# Patient Record
Sex: Male | Born: 1975 | Race: White | Hispanic: No | Marital: Married
Health system: Midwestern US, Community
[De-identification: ages and names within clinical notes are randomized; demographics above are authoritative.]

## PROBLEM LIST (undated history)

## (undated) DIAGNOSIS — I8001 Phlebitis and thrombophlebitis of superficial vessels of right lower extremity: Secondary | ICD-10-CM

## (undated) HISTORY — PX: NO PAST SURGERIES: SHX2092

## (undated) HISTORY — DX: Phlebitis and thrombophlebitis of superficial vessels of right lower extremity: I80.01

---

## 2009-11-27 MED ORDER — HYDROCODONE-ACETAMINOPHEN 5 MG-500 MG TAB
5-500 mg | ORAL_TABLET | Freq: Four times a day (QID) | ORAL | Status: DC | PRN
Start: 2009-11-27 — End: 2013-04-10

## 2009-11-27 MED ORDER — CYCLOBENZAPRINE 10 MG TAB
10 mg | ORAL_TABLET | Freq: Three times a day (TID) | ORAL | Status: AC | PRN
Start: 2009-11-27 — End: 2009-12-07

## 2009-11-27 MED ORDER — IBUPROFEN 800 MG TAB
800 mg | ORAL_TABLET | Freq: Four times a day (QID) | ORAL | Status: AC | PRN
Start: 2009-11-27 — End: 2009-12-04

## 2009-11-27 MED ADMIN — HYDROmorphone (PF) (DILAUDID) injection 1 mg: INTRAMUSCULAR | @ 15:00:00 | NDC 00409255201

## 2009-11-27 MED ADMIN — ondansetron (ZOFRAN ODT) tablet 4 mg: ORAL | @ 15:00:00 | NDC 00781523806

## 2009-11-27 MED ADMIN — ketorolac (TORADOL) injection 60 mg: INTRAMUSCULAR | @ 15:00:00 | NDC 00409379501

## 2009-11-27 NOTE — Progress Notes (Signed)
I have reviewed discharge instructions with the patient.  The patient verbalized understanding.

## 2009-11-27 NOTE — ED Provider Notes (Signed)
Patient is a 34 y.o. male presenting with back pain. The history is provided by the patient.   Back Pain   This is a new problem. The current episode started yesterday. The problem has not changed since onset. The problem occurs constantly. The pain is associated with lifting. The pain is present in the lumbar spine. The quality of the pain is described as aching. The pain radiates to the right thigh. The pain is at a severity of 4/10. The pain is moderate. Pertinent negatives include no chest pain, no fever, no numbness, no weight loss, no headaches, no abdominal pain, no abdominal swelling, no bowel incontinence, no bladder incontinence, no dysuria and no pelvic pain. He has tried nothing for the symptoms.        No past medical history on file.     No past surgical history on file.      No family history on file.     History   Social History   ??? Marital Status: Married     Spouse Name: N/A     Number of Children: N/A   ??? Years of Education: N/A   Occupational History   ??? Not on file.   Social History Main Topics   ??? Smoking status: Current Everyday Smoker   ??? Smokeless tobacco: Not on file   ??? Alcohol Use:    ??? Drug Use:    ??? Sexually Active:    Other Topics Concern   ??? Not on file   Social History Narrative   ??? No narrative on file                    ALLERGIES: Percocet      Review of Systems   Constitutional: Negative for fever, weight loss, appetite change and unexpected weight change.   HENT: Negative for facial swelling, rhinorrhea, trouble swallowing, neck pain, dental problem and ear discharge.    Eyes: Negative for pain and discharge.   Respiratory: Negative for apnea and stridor.    Cardiovascular: Negative for chest pain, palpitations and leg swelling.   Gastrointestinal: Negative for vomiting, abdominal pain, diarrhea, blood in stool, abdominal distention and bowel incontinence.   Genitourinary: Negative for bladder incontinence, dysuria, hematuria, flank pain, difficulty urinating and pelvic pain.    Musculoskeletal: Positive for back pain. Negative for myalgias, joint swelling and arthralgias.   Skin: Negative for color change, rash and wound.   Neurological: Negative for facial asymmetry, speech difficulty, numbness and headaches.   Hematological: Negative for adenopathy.   Psychiatric/Behavioral: Negative for suicidal ideas, hallucinations, behavioral problems, self-injury and agitation.       Filed Vitals:    11/27/09 0917 11/27/09 1045   BP: 120/75    Pulse: 60    Temp: 98.1 ??F (36.7 ??C)    Resp: 18    Height: 5\' 7"  (1.702 m)    Weight: 140 lb (63.504 kg)    SpO2: 100% 99%              Physical Exam   Nursing note and vitals reviewed.  Constitutional: He is oriented to person, place, and time. He appears well-developed and well-nourished. He appears distressed.   HENT:   Head: Normocephalic and atraumatic.   Right Ear: External ear normal.   Left Ear: External ear normal.   Mouth/Throat: Oropharynx is clear and moist. No oropharyngeal exudate.   Eyes: Conjunctivae and EOM are normal. Pupils are equal, round, and reactive to light. Right eye exhibits no  discharge. Left eye exhibits no discharge. No scleral icterus.   Neck: Normal range of motion. Neck supple.   Cardiovascular: Normal rate, regular rhythm, normal heart sounds and intact distal pulses.    No murmur heard.  Pulmonary/Chest: Effort normal and breath sounds normal. No respiratory distress. He has no wheezes. He has no rales.   Abdominal: Soft. He exhibits no distension. No tenderness.   Musculoskeletal: He exhibits tenderness. He exhibits no edema.        Lumbar back: He exhibits decreased range of motion, tenderness, bony tenderness and spasm.        Distal sterrngth noted limited due to pain    Neurological: He is alert and oriented to person, place, and time. No cranial nerve deficit. Coordination normal.   Skin: Skin is warm. No rash noted. No erythema.    Psychiatric: He has a normal mood and affect. His behavior is normal. Judgment and thought content normal.        MDM    Procedures

## 2009-11-27 NOTE — ED Notes (Cosign Needed)
Will send home with muscle relaxants and pain medicine and follow up

## 2009-11-27 NOTE — ED Notes (Signed)
Bent down to pick up a log yesterday and heard a pop in his low back.  Pain radiates down right leg.

## 2009-11-27 NOTE — ED Provider Notes (Signed)
I have personally seen and evaluated patient. I find the patient's history and physical exam are consistent with the PA's NP documentation. I agree with the care provided, treatments rendered, disposition and follow up plan.

## 2013-04-10 MED ADMIN — diph,Pertuss(AC),Tet Vac-PF (BOOSTRIX) suspension 0.5 mL: INTRAMUSCULAR | @ 22:00:00 | NDC 58160084243

## 2013-04-10 MED ADMIN — lidocaine (PF) (XYLOCAINE) 20 mg/mL (2 %) injection 200 mg: INTRADERMAL | @ 22:00:00 | NDC 00409206605

## 2013-04-10 NOTE — ED Provider Notes (Signed)
I personally saw and examined the patient.  I have reviewed and agree with the MLP's findings, including all diagnostic interpretations, and plans as written.   I was present during the key portions of separately billed procedures.    Alyric Parkin A Lyndell Gillyard, MD

## 2013-04-10 NOTE — ED Notes (Signed)
Pt in CT.
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
## 2013-11-19 MED ORDER — HYDROCODONE-ACETAMINOPHEN 5 MG-325 MG TAB
5-325 mg | ORAL_TABLET | ORAL | Status: AC | PRN
Start: 2013-11-19 — End: ?

## 2013-11-19 MED ADMIN — HYDROcodone-acetaminophen (NORCO) 5-325 mg per tablet 2 Tab: ORAL | @ 05:00:00 | NDC 00406012323

## 2013-11-19 MED FILL — HYDROCODONE-ACETAMINOPHEN 5 MG-325 MG TAB: 5-325 mg | ORAL | Qty: 2

## 2013-11-19 NOTE — ED Notes (Signed)
Radiology at bedside for portable left wrist xray.

## 2013-11-19 NOTE — ED Notes (Signed)
Patient discharged by provider Liborio Nixon, PA. Ambulatory from ED with wife.

## 2013-11-19 NOTE — ED Notes (Signed)
Pt was roller skating and tripped over a child and fell onto left wrist pt is complaining of left wrist pain and left hand pain

## 2013-11-19 NOTE — ED Notes (Signed)
In room with patient for initial visit. Introduced self as primary nurse. Discussed care and expectations of visit. Call light placed within reach. SR up x 2. Family at bedside.

## 2013-11-19 NOTE — ED Notes (Signed)
Dr. Bartholomew at bedside.

## 2013-11-19 NOTE — ED Provider Notes (Signed)
HPI Comments: Brent Horton. is a 38 y.o. 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 He denies head injury or other injury, he is right hand dominant. He denies tx pta. He notes his wrist hurt more with movement and the pain radiates to his hand.   He specifically denies any fevers, chills, nausea, vomiting, chest pain, shortness of breath, headache, rash, diarrhea, sweating or weight loss.  Marland Kitchen    PCP: [facility]  PMHx significant for: No past medical history on file.  PSHx significant for: No past surgical history on file.  Social Hx: Tobacco: denies current EtOH: social Illicit drug use: denies    There are no further complaints or symptoms at this time.            No past medical history on file.     No past surgical history on file.      No family history on file.     History     Social History   ??? Marital Status: MARRIED     Spouse Name: N/A     Number of Children: N/A   ??? Years of Education: N/A     Occupational History   ??? Not on file.     Social History Main Topics   ??? Smoking status: Former Smoker   ??? Smokeless tobacco: Not on file   ??? Alcohol Use: No   ??? Drug Use: Not on file   ??? Sexual Activity: Not on file     Other Topics Concern   ??? Not on file     Social History Narrative   ??? No narrative on file                  ALLERGIES: Percocet      Review of Systems   Constitutional: Negative for fever and chills.   HENT: Negative for congestion, rhinorrhea, sneezing and sore throat.    Eyes: Negative for redness and visual disturbance.   Respiratory: Negative for shortness of breath.    Cardiovascular: Negative for chest pain and leg swelling.   Gastrointestinal: Negative for nausea, vomiting and abdominal pain.   Genitourinary: Negative for frequency and difficulty urinating.   Musculoskeletal: Positive for myalgias and arthralgias. Negative for back  pain and neck stiffness.   Skin: Negative for rash.   Neurological: Negative for dizziness, syncope, weakness and headaches.   Hematological: Negative for adenopathy.       Filed Vitals:    11/19/13 0031   BP: 137/79   Pulse: 57   Temp: 98.1 ??F (36.7 ??C)   Resp: 16   Height:  (1.702 m)   Weight: 63.504 kg (140 lb)   SpO2: 100%            Physical Exam   Constitutional: He is oriented to person, place, and time. He appears well-developed and well-nourished. No distress.   HENT:   Head: Normocephalic and atraumatic.   Right Ear: External ear normal.   Left Ear: External ear normal.   Nose: Nose normal.   Mouth/Throat: Oropharynx is clear and moist.   Eyes: EOM are normal. Pupils are equal, round, and reactive to light.   Neck: Normal range of motion.   Non-tender to midline and throughout. No swelling or step off. No discoloration or deformity. No lesions. Moves neck  full ROM without diff against resistance. Grips 5/5 bilaterally.    Cardiovascular: Normal rate, regular rhythm, normal heart sounds and intact distal pulses.  Exam reveals no gallop and no friction rub.    No murmur heard.  Pulmonary/Chest: Effort normal and breath sounds normal. No stridor. No respiratory distress. He has no wheezes. He has no rales. He exhibits no tenderness.   Musculoskeletal: He exhibits tenderness. He exhibits no edema.   Left wrist TTP- more focal to radius/ anatomical snuff box.+ slight swelling. No discoloration. No deformity . No lesions. ROM intact with increased pain with full ROM. Distal n/v intact. Cap refill brisk. normthermic   Neurological: He is alert and oriented to person, place, and time. He exhibits normal muscle tone. Coordination normal.   Skin: Skin is warm and dry. No rash noted. He is not diaphoretic. No erythema. No pallor.   Psychiatric: He has a normal mood and affect. His behavior is normal.   Nursing note and vitals reviewed.       MDM  Number of Diagnoses or Management Options      Amount and/or Complexity of Data Reviewed  Tests in the radiology section of CPT??: ordered and reviewed  Obtain history from someone other than the patient: yes (wife)  Review and summarize past medical records: yes  Independent visualization of images, tracings, or specimens: yes    Patient Progress  Patient progress: improved      Procedures    Procedure Note - Splint Placement:  1:11 AM  Performed by: Freddie Breech. Amier Hoyt, PA-C  Neurovascularly intact prior to tx.  An Orthoglass thumbspica splint was placed on pt's left hand.  Joint was placed in neutral position.  Neurovascularly intact after tx.   The procedure took 1-15 minutes, and pt tolerated well.  Freddie Breech. Terilyn Sano, PA-C    Chief Complaint   Patient presents with   ??? Wrist Pain       1:28 AM  The patients presenting problems have been discussed, and they are in agreement with the care plan formulated and outlined with them.  I have encouraged them to ask questions as they arise throughout their visit.      IMAGING COMPLETED AND REVIEWED:  The following have been ordered and reviewed:  XR WRIST LT AP/LAT/OBL   Final Result   **Final Report**          ICD Codes / Adm.Diagnosis: 140016   / Wrist Pain  wrist injury   Examination:  CR WRIST MIN 3 VWS LT  - [id number] - Nov 19 2013 12:59AM   Accession No:  [id number]   Reason:  Trauma         REPORT:   INDICATION: Trauma, wrist pain      COMPARISON: None      FINDINGS:       3 views of the left wrist submitted for review.      Acute nondisplaced fracture of the scaphoid waist. No other fracture seen.       No dislocation.           IMPRESSION:      Acute nondisplaced fracture of the scaphoid waist                   Signing/Reading Doctor: Loistine Simas [facility] [id number])     ApprovedLoistine Simas [facility] [phone])  Nov 19 2013  1:18AM  MEDICATIONS GIVEN:  Medications   HYDROcodone-acetaminophen (NORCO) 5-325 mg per tablet 2 Tab (2 Tabs Oral Given 11/19/13 0059)        CLINICAL IMPRESSION:   1. Fall, initial encounter    2. Scaphoid fracture of wrist, left, closed, initial encounter          Plan  1.norco  2.f/u ortho  Return to ED if sx's worsen    DISCHARGE NOTE:  1:28 AM  Freddie BreechJanice K. Azhar Yogi, PA-C spoke with Sonia SideFrancis J Oldenburg Jr. and family about sx, dx, tx, and rx with good understanding. Care plan outlined and precautions discussed. Pt has been re-examined. Pt is feeling better. There are no new complaints, changes, or physical findings. Reviewed results of  radiology with pt and family.  Medications given in ED and prescription medications were discussed with pt and family. All pt and family???s questions and concerns were addressed.  Family was instructed and agrees to have pt f/u with Ortho, as well as to have pt return to ED upon further deterioration. Pt is ready to go home.

## 2019-02-22 ENCOUNTER — Other Ambulatory Visit: Payer: Self-pay

## 2019-02-22 ENCOUNTER — Emergency Department: Payer: No Typology Code available for payment source

## 2019-02-22 ENCOUNTER — Encounter: Payer: Self-pay | Admitting: Emergency Medicine

## 2019-02-22 ENCOUNTER — Emergency Department
Admission: EM | Admit: 2019-02-22 | Discharge: 2019-02-22 | Disposition: A | Discharge status: Home / Self Care | Payer: No Typology Code available for payment source | Attending: Emergency Medicine | Admitting: Emergency Medicine

## 2019-02-22 DIAGNOSIS — Y999 Unspecified external cause status: Secondary | ICD-10-CM | POA: Diagnosis not present

## 2019-02-22 DIAGNOSIS — Y92414 Local residential or business street as the place of occurrence of the external cause: Secondary | ICD-10-CM | POA: Insufficient documentation

## 2019-02-22 DIAGNOSIS — Z87891 Personal history of nicotine dependence: Secondary | ICD-10-CM | POA: Diagnosis not present

## 2019-02-22 DIAGNOSIS — M546 Pain in thoracic spine: Secondary | ICD-10-CM | POA: Insufficient documentation

## 2019-02-22 DIAGNOSIS — Y93I9 Activity, other involving external motion: Secondary | ICD-10-CM | POA: Diagnosis not present

## 2019-02-22 MED ORDER — MELOXICAM 15 MG PO TABS: 15.0000 mg | ORAL_TABLET | Freq: Every day | ORAL | 1 refills | Status: AC

## 2019-02-22 MED ORDER — METHOCARBAMOL 500 MG PO TABS: 500.0000 mg | ORAL_TABLET | Freq: Three times a day (TID) | ORAL | 0 refills | Status: AC | PRN
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
## 2019-02-22 NOTE — ED Notes (Signed)
Called in all waiting areas with no answer.  
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
## 2019-08-14 ENCOUNTER — Encounter: Payer: Self-pay | Admitting: Emergency Medicine

## 2019-08-14 ENCOUNTER — Emergency Department: Payer: Self-pay

## 2019-08-14 ENCOUNTER — Other Ambulatory Visit: Payer: Self-pay

## 2019-08-14 DIAGNOSIS — I8001 Phlebitis and thrombophlebitis of superficial vessels of right lower extremity: Secondary | ICD-10-CM | POA: Insufficient documentation

## 2019-08-14 DIAGNOSIS — Z87891 Personal history of nicotine dependence: Secondary | ICD-10-CM | POA: Insufficient documentation

## 2019-08-14 NOTE — ED Triage Notes (Signed)
Pt with swelling and redness to the lower right leg x5 weeks. On assessment area is swollen and hard, follows up venous track. Bruising noted as well.

## 2019-08-15 ENCOUNTER — Emergency Department
Admission: EM | Admit: 2019-08-15 | Discharge: 2019-08-15 | Disposition: A | Discharge status: Home / Self Care | Payer: Self-pay | Attending: Emergency Medicine | Admitting: Emergency Medicine

## 2019-08-15 DIAGNOSIS — I8001 Phlebitis and thrombophlebitis of superficial vessels of right lower extremity: Secondary | ICD-10-CM

## 2019-08-15 LAB — CBC WITH DIFFERENTIAL/PLATELET
Abs Immature Granulocytes: 0.01 10*3/uL (ref 0.00–0.07)
Basophils Absolute: 0.1 10*3/uL (ref 0.0–0.1)
Basophils Relative: 1 %
Eosinophils Absolute: 0.3 10*3/uL (ref 0.0–0.5)
Eosinophils Relative: 4 %
HCT: 39.1 % (ref 39.0–52.0)
Hemoglobin: 13.8 g/dL (ref 13.0–17.0)
Immature Granulocytes: 0 %
Lymphocytes Relative: 31 %
Lymphs Abs: 2.5 10*3/uL (ref 0.7–4.0)
MCH: 32.7 pg (ref 26.0–34.0)
MCHC: 35.3 g/dL (ref 30.0–36.0)
MCV: 92.7 fL (ref 80.0–100.0)
Monocytes Absolute: 0.6 10*3/uL (ref 0.1–1.0)
Monocytes Relative: 7 %
Neutro Abs: 4.7 10*3/uL (ref 1.7–7.7)
Neutrophils Relative %: 57 %
Platelets: 232 10*3/uL (ref 150–400)
RBC: 4.22 MIL/uL (ref 4.22–5.81)
RDW: 12.9 % (ref 11.5–15.5)
WBC: 8.1 10*3/uL (ref 4.0–10.5)
nRBC: 0 % (ref 0.0–0.2)

## 2019-08-15 LAB — BASIC METABOLIC PANEL
Anion gap: 6 (ref 5–15)
BUN: 21 mg/dL — ABNORMAL HIGH (ref 6–20)
CO2: 30 mmol/L (ref 22–32)
Calcium: 9.1 mg/dL (ref 8.9–10.3)
Chloride: 104 mmol/L (ref 98–111)
Creatinine, Ser: 0.98 mg/dL (ref 0.61–1.24)
GFR calc Af Amer: >60 mL/min (ref >60)
GFR calc non Af Amer: >60 mL/min (ref >60)
Glucose, Bld: 88 mg/dL (ref 70–99)
Potassium: 3.9 mmol/L (ref 3.5–5.1)
Sodium: 140 mmol/L (ref 135–145)

## 2019-08-15 LAB — BRAIN NATRIURETIC PEPTIDE: B Natriuretic Peptide: 45.4 pg/mL (ref 0.0–100.0)

## 2019-08-15 MED ORDER — IBUPROFEN 800 MG PO TABS
800.0000 mg | ORAL_TABLET | Freq: Once | ORAL | Status: AC
  Administered 2019-08-15: 800 mg via ORAL
  Filled 2019-08-15: qty 1

## 2019-08-15 MED ORDER — IBUPROFEN 800 MG PO TABS: 800.0000 mg | ORAL_TABLET | Freq: Three times a day (TID) | ORAL | 0 refills | Status: DC | PRN

## 2019-08-15 MED ORDER — DICLOFENAC SODIUM 1 % EX GEL
2.0000 g | Freq: Three times a day (TID) | CUTANEOUS | Status: DC
  Administered 2019-08-15: 2 g via TOPICAL
  Filled 2019-08-15: qty 100

## 2019-08-15 NOTE — ED Notes (Signed)
Pt provided with pair of thigh high anti-embolism stockings, size medium

## 2019-08-15 NOTE — ED Notes (Signed)
No answer when called several times from lobby 

## 2019-08-15 NOTE — ED Provider Notes (Signed)
[facility] Emergency Department Provider Note ______   First MD Initiated Contact with Patient 08/15/19 0107     (approximate)  I have reviewed the triage vital signs and the nursing notes.   HISTORY  Chief Complaint Leg Swelling    HPI Nathan Stevenson is a 44 y.o. male presents emergency department secondary to medial right lower leg pain and redness times approximately 5 weeks with worsening redness noted today.  Patient states that the area is swollen and hard to touch.  Patient denies any fever.  Patient denies any chest pain or shortness of breath.        History reviewed. No pertinent past medical history.  There are no problems to display for this patient.   History reviewed. No pertinent surgical history.  Prior to Admission medications   Not on File    Allergies Patient has no known allergies.  History reviewed. No pertinent family history.  Social History Social History   Tobacco Use  . Smoking status: Former Research scientist (life sciences)  . Smokeless tobacco: Never Used  Substance Use Topics  . Alcohol use: Yes  . Drug use: Yes    Types: Marijuana    Review of Systems Constitutional: No fever/chills Eyes: No visual changes. ENT: No sore throat. Cardiovascular: Denies chest pain. Respiratory: Denies shortness of breath. Gastrointestinal: No abdominal pain.  No nausea, no vomiting.  No diarrhea.  No constipation. Genitourinary: Negative for dysuria. Musculoskeletal: Positive for right leg pain swelling redness Integumentary: Negative for rash. Neurological: Negative for headaches, focal weakness or numbness.   ____________________________________________   PHYSICAL EXAM:  VITAL SIGNS: ED Triage Vitals [08/14/19 2133]  Enc Vitals Group     BP 133/74     Pulse Rate (!) 55     Resp 18     Temp 98.1 F (36.7 C)     Temp Source Oral     SpO2 100 %     Weight      Height      Head Circumference      Peak Flow      Pain Score       Pain Loc      Pain Edu?      Excl. in [place]?     Constitutional: Alert and oriented.  Eyes: Conjunctivae are normal.  Mouth/Throat: Patient is wearing a mask. Neck: No stridor.  No meningeal signs.   Cardiovascular: Normal rate, regular rhythm. Good peripheral circulation. Grossly normal heart sounds. Respiratory: Normal respiratory effort.  No retractions. Gastrointestinal: Soft and nontender. No distention.  Musculoskeletal: Nonblanching erythema medial aspect of the right lower leg along the course of the greater saphenous vein Neurologic:  Normal speech and language. No gross focal neurologic deficits are appreciated.  Skin:  Skin is warm, dry and intact. Psychiatric: Mood and affect are normal. Speech and behavior are normal.  ____________________________________________   LABS (all labs ordered are listed, but only abnormal results are displayed)  Labs Reviewed  BASIC METABOLIC PANEL - Abnormal; Notable for the following components:      Result Value   BUN 21 (*)    All other components within normal limits  CBC WITH DIFFERENTIAL/PLATELET  BRAIN NATRIURETIC PEPTIDE       RADIOLOGY I, [employer], personally viewed and evaluated these images (plain radiographs) as part of my medical decision making, as well as reviewing the written report by the radiologist.  ED MD interpretation: No DVT superficial thrombophlebitis noted great saphenous vein level of the  medial calf per radiologist.  Official radiology report(s): US Venous Img Lower Unilateral Left  Result Date: 08/15/2019 CLINICAL DATA:  Swelling and redness to the left lower leg. EXAM: LEFT LOWER EXTREMITY VENOUS DOPPLER ULTRASOUND TECHNIQUE: Gray-scale sonography with compression, as well as color and duplex ultrasound, were performed to evaluate the deep venous system(s) from the level of the common femoral vein through the popliteal and proximal calf veins. COMPARISON:  None. FINDINGS: VENOUS Normal  compressibility of the common femoral, superficial femoral, and popliteal veins, as well as the visualized calf veins. Visualized portions of the profundus femoral vein are unremarkable. There is superficial thrombophlebitis involving the great saphenous vein at the level of the calf. No filling defects to suggest DVT on grayscale or color Doppler imaging. Doppler waveforms show normal direction of venous flow, normal respiratory plasticity and response to augmentation. Limited views of the contralateral common femoral vein are unremarkable. OTHER None. Limitations: none IMPRESSION: 1. No DVT. 2. There is superficial thrombophlebitis involving the great saphenous vein at the level of the medial calf. Electronically Signed   By: Katherine Mantle M.D.   On: 08/15/2019 00:14     Procedures   ____________________________________________   INITIAL IMPRESSION / MDM / ASSESSMENT AND PLAN / ED COURSE  As part of my medical decision making, I reviewed the following data within the electronic MEDICAL RECORD NUMBER    44 year old male presented with above-stated history and physical exam with differential diagnosis including but not limited to thrombophlebitis, DVT, cellulitis.  Clinical exam findings not consistent with cellulitis ultrasound revealed no evidence of DVT however did reveal thrombophlebitis of the great saphenous vein which is consistent with the location of the patient's discomfort and erythema.  Voltaren ointment applied patient given ibuprofen 800 mg.  Grade 2 compression stockings also applied.   ____________________________________________  FINAL CLINICAL IMPRESSION(S) / ED DIAGNOSES  Final diagnoses:  Thrombophlebitis of superficial veins of right lower extremity     MEDICATIONS GIVEN DURING THIS VISIT:  Medications - No data to display   ED Discharge Orders    None      *Please note:  Nathan Stevenson was evaluated in Emergency Department on 08/15/2019 for the symptoms  described in the history of present illness. He was evaluated in the context of the global COVID-19 pandemic, which necessitated consideration that the patient might be at risk for infection with the SARS-CoV-2 virus that causes COVID-19. Institutional protocols and algorithms that pertain to the evaluation of patients at risk for COVID-19 are in a state of rapid change based on information released by regulatory bodies including the CDC and federal and state organizations. These policies and algorithms were followed during the patient's care in the ED.  Some ED evaluations and interventions may be delayed as a result of limited staffing during the pandemic.*  Note:  This document was prepared using Dragon voice recognition software and may include unintentional dictation errors.   Darci Current, MD 08/15/19 [phone]

## 2019-10-17 ENCOUNTER — Encounter: Payer: Self-pay | Admitting: Family Medicine

## 2019-10-17 ENCOUNTER — Other Ambulatory Visit: Payer: Self-pay

## 2019-10-17 ENCOUNTER — Ambulatory Visit: Payer: Self-pay | Admitting: Family Medicine

## 2019-10-17 VITALS — BP 118/61 | HR 53 | Temp 98.4°F | Resp 16 | Ht 66.0 in | Wt 147.2 lb

## 2019-10-17 DIAGNOSIS — R634 Abnormal weight loss: Secondary | ICD-10-CM

## 2019-10-17 DIAGNOSIS — I8002 Phlebitis and thrombophlebitis of superficial vessels of left lower extremity: Secondary | ICD-10-CM

## 2019-10-17 DIAGNOSIS — Z79899 Other long term (current) drug therapy: Secondary | ICD-10-CM

## 2019-10-17 DIAGNOSIS — Z0000 Encounter for general adult medical examination without abnormal findings: Secondary | ICD-10-CM

## 2019-10-17 DIAGNOSIS — Z7689 Persons encountering health services in other specified circumstances: Secondary | ICD-10-CM

## 2019-10-17 DIAGNOSIS — I80292 Phlebitis and thrombophlebitis of other deep vessels of left lower extremity: Secondary | ICD-10-CM

## 2019-10-17 LAB — POCT URINALYSIS DIPSTICK
Bilirubin, UA: NEGATIVE
Blood, UA: NEGATIVE
Glucose, UA: NEGATIVE
Ketones, UA: NEGATIVE
Leukocytes, UA: NEGATIVE
Nitrite, UA: NEGATIVE
Protein, UA: NEGATIVE
Spec Grav, UA: <=1.005 — AB (ref 1.010–1.025)
Urobilinogen, UA: 0.2 E.U./dL
pH, UA: 5 (ref 5.0–8.0)

## 2019-10-17 NOTE — Patient Instructions (Signed)
A referral to Hematology, for your recurrent superficial thrombophlebitis has been placed today.  If you have not heard from the specialty office or our referral coordinator within 1 week, please let us know and we will follow up with the referral coordinator for an update.  Continue to wear compression stockings as directed.  Be sure to stay active and moving around, especially on your upcoming trip to [place].  To get up and stretch/walk around every few hours  Have your labs drawn in the next week  We will plan to see you back in 4 weeks for superficial thrombophlebitis follow up visit  You will receive a survey after today's visit either digitally by e-mail or paper by USPS mail. Your experiences and feedback matter to [place].  Please respond so we know how we are doing as we provide care for you.  Call us with any questions/concerns/needs.  It is my goal to be available to you for your health concerns.  Thanks for choosing me to be a partner in your healthcare needs!  Charlaine Dalton, FNP-C Family Nurse Practitioner [facility] Phone: [phone]

## 2019-10-17 NOTE — Assessment & Plan Note (Signed)
New patient establishment to [facility] 10/17/2019.  Reports has not followed with a PCP unless has been sick.  No records to request.  Plan: 1.  Baseline labs ordered to be drawn for evaluation

## 2019-10-17 NOTE — Assessment & Plan Note (Signed)
Reports superficial thrombophlebitis of left lower extremity approx 4 months ago, resolved and then returned approx 4 days ago.  Denies any history of clotting disorder for self or family history of.  Was told by ER to wear compression stocking and is currently wearing on left lower extremity through mid thigh.  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  Palpable area of thrombophlebitis behind left knee and along medial aspect of calf.  Plan: 1. Baseline labs ordered for evaluation 2. Referral to hematology placed for recurrent superficial thrombophlebitis and evaluation for clotting disorder 3. RTC in 4 weeks

## 2019-10-17 NOTE — Progress Notes (Signed)
Subjective:    Patient ID: Nathan Stevenson, male    DOB: [birth year], 44 y.o.   MRN: [id number]  Nathan Stevenson is a 44 y.o. male presenting on 10/17/2019 for Establish Care (Thrombophlebitis of superficial veins of right lower x 2 mths. Pt state symptoms improved, but returned x 4 days ago. )   HPI  [name] presents to clinic as a new patient establishment.  Reports has not had primary care in years, typically goes to the doctor if he has an acute concern.  Denies any chronic medical history.  Has acute concerns of having superficial thrombophlebitis approximately 4 months ago, this resolved and had reoccurrence approximately 4 days ago.  Has been wearing a compression stocking that he has been wearing at all times, with the exception of when showering.  Denies any family history of clotting disorders.  Denies shortness of breath, dizziness, lightheadedness, chest pain, DOE, abdominal pain, n/v/d.  Depression screen PHQ 2/9 10/17/2019  Decreased Interest 0  Down, Depressed, Hopeless 0  PHQ - 2 Score 0    Social History   Tobacco Use  . Smoking status: Former Smoker    Types: Cigarettes    Quit date: 10/17/2011    Years since quitting: 8.0  . Smokeless tobacco: Never Used  Vaping Use  . Vaping Use: Never used  Substance Use Topics  . Alcohol use: Yes    Alcohol/week: 12.0 standard drinks    Types: 12 Cans of beer per week  . Drug use: Yes    Types: Marijuana    Review of Systems  Constitutional: Negative.   HENT: Negative.   Eyes: Negative.   Respiratory: Negative.   Cardiovascular: Negative.   Gastrointestinal: Negative.   Endocrine: Negative.   Genitourinary: Negative.   Musculoskeletal: Positive for myalgias. Negative for arthralgias, back pain, gait problem, joint swelling, neck pain and neck stiffness.  Skin: Positive for color change. Negative for pallor, rash and wound.  Allergic/Immunologic: Negative.   Neurological: Negative.   Hematological: Negative.     Psychiatric/Behavioral: Negative.    Per HPI unless specifically indicated above     Objective:    BP 118/61 (BP Location: Right Arm, Patient Position: Sitting, Cuff Size: Normal)   Pulse (!) 53   Temp 98.4 F (36.9 C) (Oral)   Resp 16   Ht 5\' 6"  (1.676 m)   Wt 147 lb 3.2 oz (66.8 kg)   SpO2 100%   BMI 23.76 kg/m   Wt Readings from Last 3 Encounters:  10/17/19 147 lb 3.2 oz (66.8 kg)  02/22/19 165 lb (74.8 kg)    Physical Exam Vitals reviewed.  Constitutional:      General: He is not in acute distress.    Appearance: Normal appearance. He is well-developed, well-groomed and normal weight. He is not ill-appearing or toxic-appearing.  HENT:     Head: Normocephalic and atraumatic.     Nose:     Comments: 02/24/19 is in place, covering mouth and nose. Eyes:     General:        Right eye: No discharge.        Left eye: No discharge.     Extraocular Movements: Extraocular movements intact.     Conjunctiva/sclera: Conjunctivae normal.     Pupils: Pupils are equal, round, and reactive to light.  Cardiovascular:     Rate and Rhythm: Normal rate and regular rhythm.     Pulses: Normal pulses.          Dorsalis pedis  pulses are 2+ on the right side and 2+ on the left side.     Heart sounds: Normal heart sounds. No murmur heard.  No friction rub. No gallop.   Pulmonary:     Effort: Pulmonary effort is normal. No respiratory distress.     Breath sounds: Normal breath sounds.  Musculoskeletal:        General: Swelling and tenderness present.     Right lower leg: No edema.     Left lower leg: No edema.       Legs:  Skin:    General: Skin is warm and dry.     Capillary Refill: Capillary refill takes less than 2 seconds.     Findings: Erythema present.  Neurological:     General: No focal deficit present.     Mental Status: He is alert and oriented to person, place, and time.  Psychiatric:        Attention and Perception: Attention and perception normal.        Mood and  Affect: Mood and affect normal.        Speech: Speech normal.        Behavior: Behavior normal. Behavior is cooperative.        Thought Content: Thought content normal.        Cognition and Memory: Cognition and memory normal.    Results for orders placed or performed in visit on 10/17/19  POCT Urinalysis Dipstick  Result Value Ref Range   Color, UA Yellow    Clarity, UA clear    Glucose, UA Negative Negative   Bilirubin, UA negative    Ketones, UA negative    Spec Grav, UA <=1.005 (A) 1.010 - 1.025   Blood, UA negative    pH, UA 5.0 5.0 - 8.0   Protein, UA Negative Negative   Urobilinogen, UA 0.2 0.2 or 1.0 E.U./dL   Nitrite, UA negative    Leukocytes, UA Negative Negative   Appearance     Odor        Assessment & Plan:   Problem List Items Addressed This Visit      Cardiovascular and Mediastinum   Superficial thrombophlebitis of left leg    Reports superficial thrombophlebitis of left lower extremity approx 4 months ago, resolved and then returned approx 4 days ago.  Denies any history of clotting disorder for self or family history of.  Was told by ER to wear compression stocking and is currently wearing on left lower extremity through mid thigh.  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  Palpable area of thrombophlebitis behind left knee and along medial aspect of calf.  Plan: 1. Baseline labs ordered for evaluation 2. Referral to hematology placed for recurrent superficial thrombophlebitis and evaluation for clotting disorder 3. RTC in 4 weeks      Relevant Medications   aspirin 325 MG tablet     Other   Encounter to establish care with new doctor    New patient establishment to [facility] 10/17/2019.  Reports has not followed with a PCP unless has been sick.  No records to request.  Plan: 1.  Baseline labs ordered to be drawn for evaluation        Other Visit Diagnoses    Routine medical exam    -  Primary   Relevant Orders   CBC with  Differential   POCT Urinalysis Dipstick (Completed)   Long-term use of high-risk medication       Relevant Orders  Lipid Profile   Weight loss       Relevant Orders   Thyroid Panel With TSH   Thrombophlebitis of left lower extremity (HCC)       Relevant Medications   aspirin 325 MG tablet   Other Relevant Orders   COMPLETE METABOLIC PANEL WITH GFR   Lipid Profile   Thyroid Panel With TSH   Ambulatory referral to Hematology      No orders of the defined types were placed in this encounter.     Follow up plan: Return in about 4 weeks (around 11/14/2019) for Superficial thrombophlebitis f/u.   Charlaine Dalton, FNP Family Nurse Practitioner [facility] 10/17/2019, 11:29 AM

## 2019-10-23 ENCOUNTER — Other Ambulatory Visit: Payer: Self-pay

## 2019-10-23 ENCOUNTER — Encounter: Payer: Self-pay | Admitting: *Deleted

## 2019-10-23 ENCOUNTER — Inpatient Hospital Stay: Payer: Self-pay | Attending: Internal Medicine | Admitting: Internal Medicine

## 2019-10-23 ENCOUNTER — Inpatient Hospital Stay: Payer: Self-pay

## 2019-10-23 DIAGNOSIS — I8002 Phlebitis and thrombophlebitis of superficial vessels of left lower extremity: Secondary | ICD-10-CM | POA: Insufficient documentation

## 2019-10-23 DIAGNOSIS — Z79899 Other long term (current) drug therapy: Secondary | ICD-10-CM | POA: Insufficient documentation

## 2019-10-23 DIAGNOSIS — Z87891 Personal history of nicotine dependence: Secondary | ICD-10-CM | POA: Insufficient documentation

## 2019-10-23 DIAGNOSIS — Z885 Allergy status to narcotic agent status: Secondary | ICD-10-CM | POA: Insufficient documentation

## 2019-10-23 DIAGNOSIS — Z7289 Other problems related to lifestyle: Secondary | ICD-10-CM | POA: Insufficient documentation

## 2019-10-23 DIAGNOSIS — M7989 Other specified soft tissue disorders: Secondary | ICD-10-CM | POA: Insufficient documentation

## 2019-10-23 DIAGNOSIS — Z809 Family history of malignant neoplasm, unspecified: Secondary | ICD-10-CM | POA: Insufficient documentation

## 2019-10-23 DIAGNOSIS — Z7901 Long term (current) use of anticoagulants: Secondary | ICD-10-CM | POA: Insufficient documentation

## 2019-10-23 LAB — CBC WITH DIFFERENTIAL/PLATELET
Abs Immature Granulocytes: 0.02 10*3/uL (ref 0.00–0.07)
Basophils Absolute: 0 10*3/uL (ref 0.0–0.1)
Basophils Relative: 0 %
Eosinophils Absolute: 0.1 10*3/uL (ref 0.0–0.5)
Eosinophils Relative: 1 %
HCT: 38.2 % — ABNORMAL LOW (ref 39.0–52.0)
Hemoglobin: 13.5 g/dL (ref 13.0–17.0)
Immature Granulocytes: 0 %
Lymphocytes Relative: 23 %
Lymphs Abs: 1.8 10*3/uL (ref 0.7–4.0)
MCH: 32.5 pg (ref 26.0–34.0)
MCHC: 35.3 g/dL (ref 30.0–36.0)
MCV: 92 fL (ref 80.0–100.0)
Monocytes Absolute: 0.5 10*3/uL (ref 0.1–1.0)
Monocytes Relative: 6 %
Neutro Abs: 5.6 10*3/uL (ref 1.7–7.7)
Neutrophils Relative %: 70 %
Platelets: 264 10*3/uL (ref 150–400)
RBC: 4.15 MIL/uL — ABNORMAL LOW (ref 4.22–5.81)
RDW: 12.1 % (ref 11.5–15.5)
WBC: 8 10*3/uL (ref 4.0–10.5)
nRBC: 0 % (ref 0.0–0.2)

## 2019-10-23 LAB — COMPREHENSIVE METABOLIC PANEL
ALT: 14 U/L (ref 0–44)
AST: 21 U/L (ref 15–41)
Albumin: 4.8 g/dL (ref 3.5–5.0)
Alkaline Phosphatase: 64 U/L (ref 38–126)
Anion gap: 7 (ref 5–15)
BUN: 16 mg/dL (ref 6–20)
CO2: 30 mmol/L (ref 22–32)
Calcium: 9.1 mg/dL (ref 8.9–10.3)
Chloride: 102 mmol/L (ref 98–111)
Creatinine, Ser: 0.98 mg/dL (ref 0.61–1.24)
GFR calc Af Amer: >60 mL/min (ref >60)
GFR calc non Af Amer: >60 mL/min (ref >60)
Glucose, Bld: 97 mg/dL (ref 70–99)
Potassium: 3.9 mmol/L (ref 3.5–5.1)
Sodium: 139 mmol/L (ref 135–145)
Total Bilirubin: 0.9 mg/dL (ref 0.3–1.2)
Total Protein: 7.8 g/dL (ref 6.5–8.1)

## 2019-10-23 LAB — ANTITHROMBIN III: AntiThromb III Func: 112 % (ref 75–120)

## 2019-10-23 MED ORDER — APIXABAN 5 MG PO TABS: 5.0000 mg | ORAL_TABLET | Freq: Two times a day (BID) | ORAL | 0 refills | Status: DC

## 2019-10-23 NOTE — Progress Notes (Signed)
[facility] CONSULT NOTE  Patient Care Team: Malfi, Jodelle Gross, FNP as PCP - General (Family Medicine)  CHIEF COMPLAINTS/PURPOSE OF CONSULTATION: DVT/PE  MAY 18th, 2021- LEFT LE US_ . No DVT. 2. There is superficial thrombophlebitis involving the great saphenous vein at the level of the medial calf. Oncology History   No history exists.     HISTORY OF PRESENTING ILLNESS:  Nathan Stevenson 44 y.o.  male new diagnosis of Superficial thrombophlebitis.   Patient admits to trauma with farm equipment-few days after noted to have swelling and pain of the superficial thrombophlebitis.  Patient was appropriate recommended conservative measures with NSAIDs.  However pain did not improve.  He has been referred to [place] for further evaluation recommendations.  With regards risk factors: Long distance travel-none  immobilization/trauma:   Admits to trauma; no immobilization. Previous history of superficial venous clot- ~ 3- 4 years [[place], [place]]  Family history: none   Review of Systems  Constitutional: Negative for chills, diaphoresis, fever, malaise/fatigue and weight loss.  HENT: Negative for nosebleeds and sore throat.   Eyes: Negative for double vision.  Respiratory: Negative for cough, hemoptysis, sputum production, shortness of breath and wheezing.   Cardiovascular: Negative for chest pain, palpitations, orthopnea and leg swelling.  Gastrointestinal: Negative for abdominal pain, blood in stool, constipation, diarrhea, heartburn, melena, nausea and vomiting.  Genitourinary: Negative for dysuria, frequency and urgency.  Musculoskeletal: Negative for back pain and joint pain.  Skin: Negative.  Negative for itching and rash.  Neurological: Negative for dizziness, tingling, focal weakness, weakness and headaches.  Endo/Heme/Allergies: Does not bruise/bleed easily.  Psychiatric/Behavioral: Negative for depression. The patient is not nervous/anxious and does not have insomnia.       MEDICAL HISTORY:  Past Medical History:  Diagnosis Date  . Thrombophlebitis of superficial veins of right lower extremity     SURGICAL HISTORY: No past surgical history on file.  SOCIAL HISTORY: Social History   Socioeconomic History  . Marital status: Legally Separated    Spouse name: Not on file  . Number of children: Not on file  . Years of education: Not on file  . Highest education level: Not on file  Occupational History  . Not on file  Tobacco Use  . Smoking status: Former Smoker    Types: Cigarettes    Quit date: 10/17/2011    Years since quitting: 8.0  . Smokeless tobacco: Never Used  Vaping Use  . Vaping Use: Never used  Substance and Sexual Activity  . Alcohol use: Yes    Alcohol/week: 12.0 standard drinks    Types: 12 Cans of beer per week  . Drug use: Yes    Types: Marijuana  . Sexual activity: Not on file  Other Topics Concern  . 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    Social Determinants of Health   Financial Resource Strain:   . Difficulty of Paying Living Expenses:   Food Insecurity:   . Worried About Programme researcher, broadcasting/film/video in the Last Year:   . Barista in the Last Year:   Transportation Needs:   . Freight forwarder (Medical):   Marland Kitchen Lack of Transportation (Non-Medical):   Physical Activity:   . Days of Exercise per Week:   . Minutes of Exercise per Session:   Stress:   . Feeling of Stress :   Social Connections:   . Frequency of  Communication with Friends and Family:   . Frequency of Social Gatherings with Friends and Family:   . Attends Religious Services:   . Active Member of Clubs or Organizations:   . Attends Banker Meetings:   Marland Kitchen Marital Status:   Intimate Partner Violence:   . Fear of Current or Ex-Partner:   . Emotionally Abused:   Marland Kitchen Physically Abused:   . Sexually Abused:     FAMILY HISTORY: Family History  Problem Relation Age  of Onset  . Cancer Mother     ALLERGIES:  is allergic to percocet [oxycodone-acetaminophen].  MEDICATIONS:  Current Outpatient Medications  Medication Sig Dispense Refill  . acetaminophen (TYLENOL) 500 MG tablet Take 1,000 mg by mouth 2 (two) times daily as needed.     Marland Kitchen apixaban (ELIQUIS) 5 MG TABS tablet Take 1 tablet (5 mg total) by mouth 2 (two) times daily. 60 tablet 0  . aspirin 325 MG tablet Take 650 mg by mouth daily as needed.     Marland Kitchen ibuprofen (ADVIL) 800 MG tablet Take 1 tablet (800 mg total) by mouth every 8 (eight) hours as needed. (Patient taking differently: Take 800 mg by mouth 3 (three) times daily. ) 30 tablet 0   No current facility-administered medications for this visit.      Marland Kitchen  PHYSICAL EXAMINATION:  Vitals:   10/23/19 1411  BP: (!) 135/77  Pulse: 56  Resp: 20  Temp: 97.9 F (36.6 C)   Filed Weights   10/23/19 1411  Weight: 143 lb (64.9 kg)    Physical Exam HENT:     Head: Normocephalic and atraumatic.     Mouth/Throat:     Pharynx: No oropharyngeal exudate.  Eyes:     Pupils: Pupils are equal, round, and reactive to light.  Cardiovascular:     Rate and Rhythm: Normal rate and regular rhythm.  Pulmonary:     Effort: Pulmonary effort is normal. No respiratory distress.     Breath sounds: Normal breath sounds. No wheezing.  Abdominal:     General: Bowel sounds are normal. There is no distension.     Palpations: Abdomen is soft. There is no mass.     Tenderness: There is no abdominal tenderness. There is no guarding or rebound.  Musculoskeletal:        General: No tenderness. Normal range of motion.     Cervical back: Normal range of motion and neck supple.  Skin:    General: Skin is warm.     Comments: Thrombophlebitis/swelling pain redness of left superficial vein  Neurological:     Mental Status: He is alert and oriented to person, place, and time.  Psychiatric:        Mood and Affect: Affect normal.      LABORATORY DATA:  I have  reviewed the data as listed Lab Results  Component Value Date   WBC 8.0 10/23/2019   HGB 13.5 10/23/2019   HCT 38.2 (L) 10/23/2019   MCV 92.0 10/23/2019   PLT 264 10/23/2019   Recent Labs    08/15/19 0049 10/23/19 1458  NA 140 139  K 3.9 3.9  CL 104 102  CO2 30 30  GLUCOSE 88 97  BUN 21* 16  CREATININE 0.98 0.98  CALCIUM 9.1 9.1  GFRNONAA >60 >60  GFRAA >60 >60  PROT  --  7.8  ALBUMIN  --  4.8  AST  --  21  ALT  --  14  ALKPHOS  --  64  BILITOT  --  0.9    RADIOGRAPHIC STUDIES: I have personally reviewed the radiological images as listed and agreed with the findings in the report. No results found.  ASSESSMENT & PLAN:   Superficial thrombophlebitis of left leg #Superficial veno [place] thrombosis-question trauma versus others.  Quite symptomatic- given the lack of improvement with conservative measures; I would recommend initiation of anticoagulation.  #Discussed bleeding precautions with the patient; including fall precautions.  Discussed with pharmacy; assistance program offered to patient.  Thank you, Dr.Malfi for allowing me to participate in the care of your pleasant patient. Please do not hesitate to contact me with questions or concerns in the interim.  # DISPOSITION:  # labs today- ordered # follow up in 4 weeks- No labs- Dr.B    All questions were answered. The patient knows to call the clinic with any problems, questions or concerns.       Earna Coder, MD 11/05/2019 7:21 PM

## 2019-10-23 NOTE — Assessment & Plan Note (Addendum)
#  Superficial veno [place] thrombosis-question trauma versus others.  Quite symptomatic- given the lack of improvement with conservative measures; I would recommend initiation of anticoagulation.  #Discussed bleeding precautions with the patient; including fall precautions.  Discussed with pharmacy; assistance program offered to patient.  Thank you, Dr.Malfi for allowing me to participate in the care of your pleasant patient. Please do not hesitate to contact me with questions or concerns in the interim.  # DISPOSITION:  # labs today- ordered # follow up in 4 weeks- No labs- Dr.B

## 2019-10-24 LAB — ANTIPHOSPHOLIPID SYNDROME PROF
Anticardiolipin IgG: <9 GPL U/mL (ref 0–14)
Anticardiolipin IgM: <9 MPL U/mL (ref 0–12)
DRVVT: 34.5 s (ref 0.0–47.0)
PTT Lupus Anticoagulant: 32 s (ref 0.0–51.9)

## 2019-10-24 LAB — PROTEIN S, TOTAL: Protein S Ag, Total: 84 % (ref 60–150)

## 2019-10-26 LAB — FACTOR 5 LEIDEN

## 2019-10-26 LAB — PROTEIN C, TOTAL: Protein C, Total: 81 % (ref 60–150)

## 2019-10-27 LAB — PROTHROMBIN GENE MUTATION

## 2019-11-20 ENCOUNTER — Inpatient Hospital Stay: Payer: Self-pay | Attending: Internal Medicine | Admitting: Internal Medicine

## 2019-11-20 ENCOUNTER — Other Ambulatory Visit: Payer: Self-pay

## 2019-11-20 ENCOUNTER — Other Ambulatory Visit: Payer: Self-pay | Admitting: *Deleted

## 2019-11-20 DIAGNOSIS — Z87891 Personal history of nicotine dependence: Secondary | ICD-10-CM | POA: Insufficient documentation

## 2019-11-20 DIAGNOSIS — Z7901 Long term (current) use of anticoagulants: Secondary | ICD-10-CM | POA: Insufficient documentation

## 2019-11-20 DIAGNOSIS — Z79899 Other long term (current) drug therapy: Secondary | ICD-10-CM | POA: Insufficient documentation

## 2019-11-20 DIAGNOSIS — Z885 Allergy status to narcotic agent status: Secondary | ICD-10-CM | POA: Insufficient documentation

## 2019-11-20 DIAGNOSIS — I8002 Phlebitis and thrombophlebitis of superficial vessels of left lower extremity: Secondary | ICD-10-CM

## 2019-11-20 DIAGNOSIS — Z86718 Personal history of other venous thrombosis and embolism: Secondary | ICD-10-CM | POA: Insufficient documentation

## 2019-11-20 DIAGNOSIS — Z809 Family history of malignant neoplasm, unspecified: Secondary | ICD-10-CM | POA: Insufficient documentation

## 2019-11-20 MED ORDER — APIXABAN 5 MG PO TABS: 5.0000 mg | ORAL_TABLET | Freq: Two times a day (BID) | ORAL | 0 refills | Status: AC

## 2019-11-20 MED ORDER — APIXABAN 5 MG PO TABS: 5.0000 mg | ORAL_TABLET | Freq: Two times a day (BID) | ORAL | 0 refills | Status: DC

## 2019-11-20 NOTE — Assessment & Plan Note (Addendum)
#  Superficial venous thrombosis-left lower extremity; currently on Eliquis for the last 1 month.  Etiology is unclear; hypercoagulable work-up negative so far.  Improvement noted however completely not resolved.    Would recommend continue anticoagulation-given the lack of complete resolution of symptoms.  Recommend ultrasound of the lower extremity.  We will also refer to vascular surgery for further evaluation recommendations.   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

## 2019-11-20 NOTE — Progress Notes (Signed)
[facility] CONSULT NOTE  Patient Care Team: Malfi, Jodelle Gross, FNP as PCP - General (Family Medicine)  CHIEF COMPLAINTS/PURPOSE OF CONSULTATION: DVT/PE  MAY 18th, 2021- LEFT LE [place] . No DVT-superficial thrombophlebitis involving the great saphenous vein at the level of the medial calf.  July 24th 2021-Eliquis; hypercoagulable work-up negative. Prior history of SVT 3 to 4 years ago [[place]]  Oncology History   No history exists.     HISTORY OF PRESENTING ILLNESS:  Nathan Stevenson 44 y.o.  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  Patient was started on Eliquis approximately 4 weeks ago given the lack of improvement of his leg symptoms with conservative measures.  Patient swelling pain improved however not complete as well.  He continues on Eliquis.  Admits to compliance.  Review of Systems  Constitutional: Negative for chills, diaphoresis, fever, malaise/fatigue and weight loss.  HENT: Negative for nosebleeds and sore throat.   Eyes: Negative for double vision.  Respiratory: Negative for cough, hemoptysis, sputum production, shortness of breath and wheezing.   Cardiovascular: Negative for chest pain, palpitations, orthopnea and leg swelling.  Gastrointestinal: Negative for abdominal pain, blood in stool, constipation, diarrhea, heartburn, melena, nausea and vomiting.  Genitourinary: Negative for dysuria, frequency and urgency.  Musculoskeletal: Negative for back pain and joint pain.  Skin: Negative.  Negative for itching and rash.  Neurological: Negative for dizziness, tingling, focal weakness, weakness and headaches.  Endo/Heme/Allergies: Does not bruise/bleed easily.  Psychiatric/Behavioral: Negative for depression. The patient is not nervous/anxious and does not have insomnia.      MEDICAL HISTORY:  Past Medical History:  Diagnosis Date  .  Thrombophlebitis of superficial veins of right lower extremity     SURGICAL HISTORY: No past surgical history on file.  SOCIAL HISTORY: Social History   Socioeconomic History  . Marital status: Legally Separated    Spouse name: Not on file  . Number of children: Not on file  . Years of education: Not on file  . Highest education level: Not on file  Occupational History  . Not on file  Tobacco Use  . Smoking status: Former Smoker    Types: Cigarettes    Quit date: 10/17/2011    Years since quitting: 8.0  . Smokeless tobacco: Never Used  Vaping Use  . Vaping Use: Never used  Substance and Sexual Activity  . Alcohol use: Yes    Alcohol/week: 12.0 standard drinks    Types: 12 Cans of beer per week  . Drug use: Yes    Types: Marijuana  . Sexual activity: Not on file  Other Topics Concern  . 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    Social Determinants of Health   Financial Resource Strain:   . Difficulty of Paying Living Expenses: Not on file  Food Insecurity:   . Worried About Programme researcher, broadcasting/film/video in the Last Year: Not on file  . Ran Out of Food in the Last Year: Not on file  Transportation Needs:   . Lack of Transportation (Medical): Not on file  . Lack of Transportation (Non-Medical): Not on file  Physical Activity:   . Days of Exercise per Week: Not on file  . Minutes of Exercise per Session: Not on file  Stress:   . Feeling of Stress : Not on file  Social Connections:   . Frequency of Communication with Friends and Family: Not on file  . Frequency of Social Gatherings with Friends and Family: Not on file  . Attends Religious Services: Not on file  . Active Member of Clubs or Organizations: Not on file  . Attends Banker Meetings: Not on file  . Marital Status: Not on file  Intimate Partner Violence:   . Fear of Current or Ex-Partner: Not on file  . Emotionally Abused: Not on  file  . Physically Abused: Not on file  . Sexually Abused: Not on file    FAMILY HISTORY: Family History  Problem Relation Age of Onset  . Cancer Mother     ALLERGIES:  is allergic to percocet [oxycodone-acetaminophen].  MEDICATIONS:  Current Outpatient Medications  Medication Sig Dispense Refill  . acetaminophen (TYLENOL) 500 MG tablet Take 1,000 mg by mouth 2 (two) times daily as needed.     Marland Kitchen apixaban (ELIQUIS) 5 MG TABS tablet Take 1 tablet (5 mg total) by mouth 2 (two) times daily. 60 tablet 0   No current facility-administered medications for this visit.      Marland Kitchen  PHYSICAL EXAMINATION:  Vitals:   11/20/19 1300  BP: (!) 142/85  Pulse: (!) 48  Resp: 20  Temp: (!) 96.5 F (35.8 C)   Filed Weights   11/20/19 1309  Weight: 143 lb (64.9 kg)    Physical Exam HENT:     Head: Normocephalic and atraumatic.     Mouth/Throat:     Pharynx: No oropharyngeal exudate.  Eyes:     Pupils: Pupils are equal, round, and reactive to light.  Cardiovascular:     Rate and Rhythm: Normal rate and regular rhythm.  Pulmonary:     Effort: Pulmonary effort is normal. No respiratory distress.     Breath sounds: Normal breath sounds. No wheezing.  Abdominal:     General: Bowel sounds are normal. There is no distension.     Palpations: Abdomen is soft. There is no mass.     Tenderness: There is no abdominal tenderness. There is no guarding or rebound.  Musculoskeletal:        General: No tenderness. Normal range of motion.     Cervical back: Normal range of motion and neck supple.  Skin:    General: Skin is warm.     Comments: Thrombophlebitis/swelling pain redness of left superficial vein  Neurological:     Mental Status: He is alert and oriented to person, place, and time.  Psychiatric:        Mood and Affect: Affect normal.      LABORATORY DATA:  I have reviewed the data as listed Lab Results  Component Value Date   WBC 8.0 10/23/2019   HGB 13.5 10/23/2019   HCT 38.2  (L) 10/23/2019   MCV 92.0 10/23/2019   PLT 264 10/23/2019   Recent Labs    08/15/19 0049 10/23/19 1458  NA 140 139  K 3.9 3.9  CL 104 102  CO2 30 30  GLUCOSE 88 97  BUN 21* 16  CREATININE 0.98 0.98  CALCIUM 9.1 9.1  GFRNONAA >60 >60  GFRAA >60 >60  PROT  --  7.8  ALBUMIN  --  4.8  AST  --  21  ALT  --  14  ALKPHOS  --  64  BILITOT  --  0.9    RADIOGRAPHIC STUDIES: I have personally reviewed the radiological images as listed and agreed with the findings  in the report. No results found.  ASSESSMENT & PLAN:   Superficial thrombophlebitis of left leg #Superficial venous thrombosis-left lower extremity; currently on Eliquis for the last 1 month.  Etiology is unclear; hypercoagulable work-up negative so far.  Improvement noted however completely not resolved.    Would recommend continue anticoagulation-given the lack of complete resolution of symptoms.  Recommend ultrasound of the lower extremity.  We will also refer to vascular surgery for further evaluation recommendations.   # DISPOSITION:  # [place] Left LE ASAP # referral to Dr.Dew/Schnier re: recurrent SVT # follow up in 4 weeks- MD; no labs- Dr.B    Earna Coder, MD 11/20/2019 2:29 PM

## 2019-11-21 ENCOUNTER — Ambulatory Visit
Admission: RE | Admit: 2019-11-21 | Discharge: 2019-11-21 | Disposition: A | Discharge status: Home / Self Care | Payer: Self-pay | Source: Ambulatory Visit | Attending: Internal Medicine | Admitting: Internal Medicine

## 2019-11-21 ENCOUNTER — Other Ambulatory Visit: Payer: Self-pay

## 2019-11-21 ENCOUNTER — Telehealth: Payer: Self-pay | Admitting: Pharmacy Technician

## 2019-11-21 DIAGNOSIS — I8002 Phlebitis and thrombophlebitis of superficial vessels of left lower extremity: Secondary | ICD-10-CM | POA: Insufficient documentation

## 2019-11-21 NOTE — Telephone Encounter (Signed)
[name] Encounter  Met patient in room to complete application for [place] Patient Assistance in an effort to reduce patient's out of pocket expense for Eliquis to $0.    Application completed and faxed to [phone].   [place] patient assistance phone number for follow up is [phone].   This encounter will be updated until final determination.   [place] Patient [place] Phone [phone] Fax [phone] 11/21/2019 3:24 PM

## 2019-11-22 ENCOUNTER — Telehealth: Payer: Self-pay | Admitting: Internal Medicine

## 2019-11-22 NOTE — Telephone Encounter (Signed)
I tried to reach pt re: results of [place]- STABLE/ but continued SVT. Await evaluation with Dr.Dew as planned on 08/27.  H-please inform pt of above; follow up as planned. Thanks, GB  FYI-Dr.Dew

## 2019-11-23 NOTE — Telephone Encounter (Signed)
Called patient and left message to return call about needing financial documents for application.  Daine Floras CPHT Specialty Pharmacy Patient Advocate [facility] Phone [phone] Fax [phone] 11/23/2019 4:02 PM

## 2019-11-23 NOTE — Telephone Encounter (Signed)
My chart msg sent to patient. Also left a detailed vm for patient.

## 2019-11-24 ENCOUNTER — Ambulatory Visit (INDEPENDENT_AMBULATORY_CARE_PROVIDER_SITE_OTHER): Payer: Self-pay | Admitting: Nurse Practitioner

## 2019-11-24 ENCOUNTER — Encounter (INDEPENDENT_AMBULATORY_CARE_PROVIDER_SITE_OTHER): Payer: Self-pay | Admitting: Nurse Practitioner

## 2019-11-24 ENCOUNTER — Telehealth: Payer: Self-pay | Admitting: Pharmacy Technician

## 2019-11-24 ENCOUNTER — Other Ambulatory Visit: Payer: Self-pay

## 2019-11-24 VITALS — BP 132/92 | HR 60 | Resp 16 | Ht 65.5 in | Wt 139.0 lb

## 2019-11-24 DIAGNOSIS — I8002 Phlebitis and thrombophlebitis of superficial vessels of left lower extremity: Secondary | ICD-10-CM

## 2019-11-24 DIAGNOSIS — M79662 Pain in left lower leg: Secondary | ICD-10-CM

## 2019-11-24 NOTE — Telephone Encounter (Signed)
[name] Encounter  Patient returned my call this morning.  He emailed me 2 of his paystubs to submit to [id number].    Faxed paystubs to [phone].  Patient did receive more samples of Eliquis from Vascular office to get him through.  Daine Floras CPHT Specialty Pharmacy Patient Advocate [facility] Phone [phone] Fax [phone] 11/24/2019 9:48 AM

## 2019-11-24 NOTE — Telephone Encounter (Addendum)
Erroneous encounter

## 2019-11-24 NOTE — Progress Notes (Signed)
Subjective:    Patient ID: Nathan Stevenson, male    DOB: 1975/09/16, 44 y.o.   MRN: [id number] Chief Complaint  Patient presents with  . New Patient (Initial Visit)    ref Brahmanday superficial thrombophlebitis left leg    The patient presents today as a new patient on referral from Danielle Rankin, FNP in regards to a recurrent superficial thrombophlebitis.  The patient notes that he had the initial thrombophlebitis sometime in May and it was located in his calf region.  However within the last 4 weeks the patient's recurrent thrombophlebitis has relocated more proximally in the medial thigh anterior to his knee.  The area is not severely red or swollen however it is still very tender to the touch.  Patient also notes that he has been having difficulty with walking as well.  He notes it is as if his muscles are pulling and snapping any has to stop walking.  At that point the sensation stops but with resumption of activity the pain returns.  The patient was seen previously by hematology which noted no hypercoagulable factors.  The patient has been on Eliquis for approximately last 4 weeks.  He denies any fever, chills, nausea, vomiting or diarrhea.   Review of Systems  Musculoskeletal: Positive for gait problem and myalgias.  All other systems reviewed and are negative.      Objective:   Physical Exam Vitals reviewed.  HENT:     Head: Normocephalic.  Cardiovascular:     Rate and Rhythm: Normal rate.     Pulses: Normal pulses.  Pulmonary:     Effort: Pulmonary effort is normal.  Musculoskeletal:        General: Tenderness present.  Skin:    General: Skin is warm and dry.  Neurological:     Mental Status: He is alert and oriented to person, place, and time.  Psychiatric:        Mood and Affect: Mood normal.        Behavior: Behavior normal.        Thought Content: Thought content normal.        Judgment: Judgment normal.     BP (!) 132/92 (BP Location: Right Arm)   Pulse 60    Resp 16   Ht 5' 5.5" (1.664 m)   Wt 139 lb (63 kg)   BMI 22.78 kg/m   Past Medical History:  Diagnosis Date  . 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 Marital status: Legally Separated    Spouse name: Not on file  . Number of children: Not on file  . Years of education: Not on file  . Highest education level: Not on file  Occupational History  . Not on file  Tobacco Use  . Smoking status: Former Smoker    Types: Cigarettes    Quit date: 10/17/2011    Years since quitting: 8.1  . Smokeless tobacco: Never Used  Vaping Use  . Vaping Use: Never used  Substance and Sexual Activity  . Alcohol use: Yes    Alcohol/week: 12.0 standard drinks    Types: 12 Cans of beer per week  . Drug use: Yes    Types: Marijuana  . Sexual activity: Not on file  Other Topics Concern  . 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    Social Determinants of Health  Financial Resource Strain:   . Difficulty of Paying Living Expenses: Not on file  Food Insecurity:   . Worried About Programme researcher, broadcasting/film/video in the Last Year: Not on file  . Ran Out of Food in the Last Year: Not on file  Transportation Needs:   . Lack of Transportation (Medical): Not on file  . Lack of Transportation (Non-Medical): Not on file  Physical Activity:   . Days of Exercise per Week: Not on file  . Minutes of Exercise per Session: Not on file  Stress:   . Feeling of Stress : Not on file  Social Connections:   . Frequency of Communication with Friends and Family: Not on file  . Frequency of Social Gatherings with Friends and Family: Not on file  . Attends Religious Services: Not on file  . Active Member of Clubs or Organizations: Not on file  . Attends Banker Meetings: Not on file  . Marital Status: Not on file  Intimate Partner Violence:   . Fear of Current or  Ex-Partner: Not on file  . Emotionally Abused: Not on file  . Physically Abused: Not on file  . Sexually Abused: Not on file    Past Surgical History:  Procedure Laterality Date  . NO PAST SURGERIES      Family History  Problem Relation Age of Onset  . Cancer Mother     Allergies  Allergen Reactions  . Percocet [Oxycodone-Acetaminophen] Hives       Assessment & Plan:    1. Superficial thrombophlebitis of left leg We will have the patient return in the next 4 to 6 weeks for evaluation for possible venous reflux.  We will wait 4 to 6 weeks to allow the superficial thrombophlebitis time to resolve before we evaluate the patient's venous status.  In the meantime the patient is advised to continue with Eliquis, in addition to utilizing diclofenac gel over the area for pain relief.  If he is unable to locate diclofenac gel over-the-counter he can utilize 800 mg of ibuprofen.  He is advised to utilize it sparingly due to his Eliquis. - VAS [place] LOWER EXTREMITY VENOUS REFLUX; Future  2. Pain in left lower leg The patient's description of pain is suspicious for claudication.  Given the patient's previous history of smoking, we will rule out arterial causes for this leg pain as well at the patient's follow-up visit. - VAS [place] ABI WITH/WO TBI; Future   Current Outpatient Medications on File Prior to Visit  Medication Sig Dispense Refill  . acetaminophen (TYLENOL) 500 MG tablet Take 1,000 mg by mouth 2 (two) times daily as needed.     Marland Kitchen apixaban (ELIQUIS) 5 MG TABS tablet Take 1 tablet (5 mg total) by mouth 2 (two) times daily. 60 tablet 0   No current facility-administered medications on file prior to visit.    There are no Patient Instructions on file for this visit. No follow-ups on file.   Georgiana Spinner, NP

## 2019-11-28 NOTE — Telephone Encounter (Signed)
[name] Encounter  Received notification from [facility] Patient [facility] that patient has been successfully enrolled into their program to receive Eliquis from the manufacturer at $0 out of pocket until 11/21/20.    I called and spoke with patient.  He knows we will have to re-apply.   Specialty Pharmacy that will dispense medication is [facility] [phone]).  Patient knows to call the office with questions or concerns.   Oral Oncology Clinic will continue to follow.  Daine Floras CPHT Specialty Pharmacy Patient Advocate [facility] Phone [phone] Fax [phone] 11/28/2019 3:32 PM

## 2019-12-18 ENCOUNTER — Other Ambulatory Visit: Payer: Self-pay

## 2019-12-18 ENCOUNTER — Inpatient Hospital Stay: Payer: Self-pay | Attending: Internal Medicine | Admitting: Internal Medicine

## 2019-12-18 DIAGNOSIS — Z87891 Personal history of nicotine dependence: Secondary | ICD-10-CM | POA: Insufficient documentation

## 2019-12-18 DIAGNOSIS — M79605 Pain in left leg: Secondary | ICD-10-CM | POA: Insufficient documentation

## 2019-12-18 DIAGNOSIS — Z86718 Personal history of other venous thrombosis and embolism: Secondary | ICD-10-CM | POA: Insufficient documentation

## 2019-12-18 DIAGNOSIS — I82812 Embolism and thrombosis of superficial veins of left lower extremities: Secondary | ICD-10-CM | POA: Insufficient documentation

## 2019-12-18 DIAGNOSIS — Z885 Allergy status to narcotic agent status: Secondary | ICD-10-CM | POA: Insufficient documentation

## 2019-12-18 DIAGNOSIS — Z809 Family history of malignant neoplasm, unspecified: Secondary | ICD-10-CM | POA: Insufficient documentation

## 2019-12-18 DIAGNOSIS — Z7901 Long term (current) use of anticoagulants: Secondary | ICD-10-CM | POA: Insufficient documentation

## 2019-12-18 DIAGNOSIS — I8002 Phlebitis and thrombophlebitis of superficial vessels of left lower extremity: Secondary | ICD-10-CM

## 2019-12-18 NOTE — Progress Notes (Signed)
[facility] CONSULT NOTE  Patient Care Team: Malfi, Jodelle Gross, FNP as PCP - General (Family Medicine)  CHIEF COMPLAINTS/PURPOSE OF CONSULTATION: DVT/PE  MAY 18th, 2021- LEFT LE [place] . No DVT-superficial thrombophlebitis involving the great saphenous vein at the level of the medial calf.  July 24th 2021-Eliquis; hypercoagulable work-up negative. Prior history of SVT 3 to 4 years ago [[place]]; SEP 2021- vascular eval.   Oncology History   No history exists.     HISTORY OF PRESENTING ILLNESS:  Nathan Stevenson 44 y.o.  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  In the interim patient was evaluated by vascular surgery awaiting repeat Dopplers.   He notes a significant improvement of his leg swelling/pain since being on Eliquis.  He is also diligently using his compression stockings.  Review of Systems  Constitutional: Negative for chills, diaphoresis, fever, malaise/fatigue and weight loss.  HENT: Negative for nosebleeds and sore throat.   Eyes: Negative for double vision.  Respiratory: Negative for cough, hemoptysis, sputum production, shortness of breath and wheezing.   Cardiovascular: Negative for chest pain, palpitations, orthopnea and leg swelling.  Gastrointestinal: Negative for abdominal pain, blood in stool, constipation, diarrhea, heartburn, melena, nausea and vomiting.  Genitourinary: Negative for dysuria, frequency and urgency.  Musculoskeletal: Negative for back pain and joint pain.  Skin: Negative.  Negative for itching and rash.  Neurological: Negative for dizziness, tingling, focal weakness, weakness and headaches.  Endo/Heme/Allergies: Does not bruise/bleed easily.  Psychiatric/Behavioral: Negative for depression. The patient is not nervous/anxious and does not have insomnia.      MEDICAL HISTORY:  Past Medical History:  Diagnosis Date    Thrombophlebitis of superficial veins of right lower extremity     SURGICAL HISTORY: Past Surgical History:  Procedure Laterality Date   NO PAST SURGERIES      SOCIAL HISTORY: Social History   Socioeconomic History   Marital status: Legally Separated    Spouse name: Not on file   Number of children: Not on file   Years of education: Not on file   Highest education level: Not on file  Occupational History   Not on file  Tobacco Use   Smoking status: Former Smoker    Types: Cigarettes    Quit date: 10/17/2011    Years since quitting: 8.1   Smokeless tobacco: Never Used  Vaping Use   Vaping Use: Never used  Substance and Sexual Activity   Alcohol use: Yes    Alcohol/week: 12.0 standard drinks    Types: 12 Cans of beer per week   Drug use: Yes    Types: Marijuana   Sexual activity: Not on file  Other Topics Concern   Not on file  Social History Narrative   Quit smoking 8 years ago; 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    Social Determinants of Health   Financial Resource Strain:    Difficulty of Paying Living Expenses: Not on file  Food Insecurity:    Worried About Programme researcher, broadcasting/film/video in the Last Year: Not on file   [employer] of Food in the Last Year: Not on file  Transportation Needs:    Lack of Transportation (Medical): Not on file   Lack of Transportation (Non-Medical): Not on file  Physical Activity:    Days of Exercise per Week: Not on file   Minutes of Exercise per Session: Not on file  Stress:    Feeling of Stress : Not on file  Social Connections:    Frequency of Communication with Friends and Family: Not on file   Frequency of Social Gatherings with Friends and Family: Not on file   Attends Religious Services: Not on file   Active Member of Clubs or Organizations: Not on file   Attends Banker Meetings: Not on file   Marital Status: Not on file  Intimate Partner Violence:    Fear of Current or Ex-Partner:  Not on file   Emotionally Abused: Not on file   Physically Abused: Not on file   Sexually Abused: Not on file    FAMILY HISTORY: Family History  Problem Relation Age of Onset   Cancer Mother     ALLERGIES:  is allergic to percocet [oxycodone-acetaminophen].  MEDICATIONS:  Current Outpatient Medications  Medication Sig Dispense Refill   acetaminophen (TYLENOL) 500 MG tablet Take 1,000 mg by mouth 2 (two) times daily as needed.      apixaban (ELIQUIS) 5 MG TABS tablet Take 1 tablet (5 mg total) by mouth 2 (two) times daily. 60 tablet 0   No current facility-administered medications for this visit.      Marland Kitchen  PHYSICAL EXAMINATION:  Vitals:   12/18/19 1450  BP: 127/85  Pulse: 60  Resp: 18  Temp: (!) 97.1 F (36.2 C)  SpO2: 100%   Filed Weights   12/18/19 1450  Weight: 140 lb (63.5 kg)    Physical Exam HENT:     Head: Normocephalic and atraumatic.     Mouth/Throat:     Pharynx: No oropharyngeal exudate.  Eyes:     Pupils: Pupils are equal, round, and reactive to light.  Cardiovascular:     Rate and Rhythm: Normal rate and regular rhythm.  Pulmonary:     Effort: Pulmonary effort is normal. No respiratory distress.     Breath sounds: Normal breath sounds. No wheezing.  Abdominal:     General: Bowel sounds are normal. There is no distension.     Palpations: Abdomen is soft. There is no mass.     Tenderness: There is no abdominal tenderness. There is no guarding or rebound.  Musculoskeletal:        General: No tenderness. Normal range of motion.     Cervical back: Normal range of motion and neck supple.  Skin:    General: Skin is warm.     Comments: Thrombophlebitis/swelling pain redness of left superficial vein  Neurological:     Mental Status: He is alert and oriented to person, place, and time.  Psychiatric:        Mood and Affect: Affect normal.      LABORATORY DATA:  I have reviewed the data as listed Lab Results  Component Value Date   WBC  8.0 10/23/2019   HGB 13.5 10/23/2019   HCT 38.2 (L) 10/23/2019   MCV 92.0 10/23/2019   PLT 264 10/23/2019   Recent Labs    08/15/19 0049 10/23/19 1458  NA 140 139  K 3.9 3.9  CL 104 102  CO2 30 30  GLUCOSE 88 97  BUN 21* 16  CREATININE 0.98 0.98  CALCIUM 9.1 9.1  GFRNONAA >60 >60  GFRAA >60 >60  PROT  --  7.8  ALBUMIN  --  4.8  AST  --  21  ALT  --  14  ALKPHOS  --  64  BILITOT  --  0.9    RADIOGRAPHIC STUDIES: I  have personally reviewed the radiological images as listed and agreed with the findings in the report. US Venous Img Lower Unilateral Left (DVT)  Result Date: 11/22/2019 CLINICAL DATA:  GSV superficial thrombosis, persistent pain EXAM: LEFT LOWER EXTREMITY VENOUS DOPPLER ULTRASOUND TECHNIQUE: Gray-scale sonography with graded compression, as well as color Doppler and duplex ultrasound were performed to evaluate the lower extremity deep venous systems from the level of the common femoral vein and including the common femoral, femoral, profunda femoral, popliteal and calf veins including the posterior tibial, peroneal and gastrocnemius veins when visible. The superficial great saphenous vein was also interrogated. Spectral Doppler was utilized to evaluate flow at rest and with distal augmentation maneuvers in the common femoral, femoral and popliteal veins. COMPARISON:  None. FINDINGS: Contralateral Common Femoral Vein: Respiratory phasicity is normal and symmetric with the symptomatic side. No evidence of thrombus. Normal compressibility. Common Femoral Vein: No evidence of thrombus. Normal compressibility, respiratory phasicity and response to augmentation. Saphenofemoral Junction: No evidence of thrombus. Normal compressibility and flow on color Doppler imaging. Profunda Femoral Vein: No evidence of thrombus. Normal compressibility and flow on color Doppler imaging. Femoral Vein: No evidence of thrombus. Normal compressibility, respiratory phasicity and response to  augmentation. Popliteal Vein: No evidence of thrombus. Normal compressibility, respiratory phasicity and response to augmentation. Calf Veins: No evidence of thrombus. Normal compressibility and flow on color Doppler imaging. Superficial Great Saphenous Vein: Similar superficial thrombosis of the left GSV beginning above the knee and extending inferiorly into the proximal medial calf. No significant change. IMPRESSION: Negative for left lower extremity DVT. Similar superficial thrombosis/thrombophlebitis of the left GSV beginning just above the knee and extending into the proximal calf. Electronically Signed   By: Judie Petit.  Shick M.D.   On: 11/22/2019 07:49    ASSESSMENT & PLAN:   Superficial thrombophlebitis of left leg #Superficial venous thrombosis-left lower extremity; currently on Eliquis for the last 2 month.  Etiology is unclear; hypercoagulable work-up negative so far.  Improvement noted however completely not resolved; continue eliquis for 2 more months [total of 4 months].    # Low clinical suspicion for underlying malignancy causing superficial venous thrombosis-given prior SVT/also no systemic symptoms.  Discussed with patient.  He feels with comfortable with the plan.  # DISPOSITION:  # follow up in 2 months MD; no labs- Dr.B    Earna Coder, MD 12/18/2019 3:53 PM

## 2019-12-18 NOTE — Assessment & Plan Note (Addendum)
#  Superficial venous thrombosis-left lower extremity; currently on Eliquis for the last 2 month.  Etiology is unclear; hypercoagulable work-up negative so far.  Improvement noted however completely not resolved; continue eliquis for 2 more months [total of 4 months].    # Low clinical suspicion for underlying malignancy causing superficial venous thrombosis-given prior SVT/also no systemic symptoms.  Discussed with patient.  He feels with comfortable with the plan.  # DISPOSITION:  # follow up in 2 months MD; no labs- Dr.B

## 2019-12-28 ENCOUNTER — Encounter (INDEPENDENT_AMBULATORY_CARE_PROVIDER_SITE_OTHER): Payer: Self-pay | Admitting: Nurse Practitioner

## 2019-12-28 ENCOUNTER — Ambulatory Visit (INDEPENDENT_AMBULATORY_CARE_PROVIDER_SITE_OTHER): Payer: Self-pay

## 2019-12-28 ENCOUNTER — Other Ambulatory Visit: Payer: Self-pay

## 2019-12-28 ENCOUNTER — Ambulatory Visit (INDEPENDENT_AMBULATORY_CARE_PROVIDER_SITE_OTHER): Payer: Self-pay | Admitting: Nurse Practitioner

## 2019-12-28 VITALS — BP 119/81 | HR 55 | Ht 65.0 in | Wt 140.0 lb

## 2019-12-28 DIAGNOSIS — M79662 Pain in left lower leg: Secondary | ICD-10-CM

## 2019-12-28 DIAGNOSIS — I8002 Phlebitis and thrombophlebitis of superficial vessels of left lower extremity: Secondary | ICD-10-CM

## 2019-12-28 NOTE — Progress Notes (Signed)
Subjective:    Patient ID: Nathan Stevenson, male    DOB: [birth year], 44 y.o.   MRN: [id number] Chief Complaint  Patient presents with  . Follow-up    6 week LLE ven reflux    The patient returns today regarding left lower extremity recurrent superficial thrombophlebitis as well as lower extremity leg pain.  There was a concern that the patient may have experienced some claudication due to the description of pain.  However today the patient notes that the pain is essentially gone.  The patient has began utilizing thigh-high compression in addition to his knee-high compression socks and the pain has essentially resolved.  He is able to do his normal everyday activities including work without issue.  He denies any fever, chills, nausea, vomiting, diarrhea or shortness of breath.  He denies any chest pain or TIA-like symptoms.  Overall the patient is doing well.  The patient also recently followed up with hematology which did not have extensive concerns regarding hypercoagulation.  The patient reports remaining on Eliquis without issue.  Today noninvasive studies show ABIs of 1.31 bilaterally with strong triphasic tibial artery waveforms with good toe waveforms  Lower extremity venous reflux study shows no evidence of deep venous insufficiency or superficial venous reflux in the left lower extremity.  Thrombophlebitis is still present in the left great saphenous vein from the mid calf extending to the mid to distal thigh area.  The thrombus in the mid to distal thigh area is still showing evidence of acute  thrombus.   Review of Systems  Hematological: Bruises/bleeds easily.  All other systems reviewed and are negative.      Objective:   Physical Exam Vitals reviewed.  HENT:     Head: Normocephalic.  Cardiovascular:     Rate and Rhythm: Normal rate and regular rhythm.     Pulses: Normal pulses.  Pulmonary:     Effort: Pulmonary effort is normal.  Musculoskeletal:     Left lower leg: No  edema.  Skin:    General: Skin is warm and dry.  Neurological:     Mental Status: He is alert and oriented to person, place, and time.  Psychiatric:        Mood and Affect: Mood normal.        Behavior: Behavior normal.        Thought Content: Thought content normal.        Judgment: Judgment normal.     BP 119/81   Pulse (!) 55   Ht 5\' 5"  (1.651 m)   Wt 140 lb (63.5 kg)   BMI 23.30 kg/m   Past Medical History:  Diagnosis Date  . 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 Marital status: Legally Separated    Spouse name: Not on file  . Number of children: Not on file  . Years of education: Not on file  . Highest education level: Not on file  Occupational History  . Not on file  Tobacco Use  . Smoking status: Former Smoker    Types: Cigarettes    Quit date: 10/17/2011    Years since quitting: 8.2  . Smokeless tobacco: Never Used  Vaping Use  . Vaping Use: Never used  Substance and Sexual Activity  . Alcohol use: Yes    Alcohol/week: 12.0 standard drinks    Types: 12 Cans of beer per week  . Drug use: Yes    Types:  Marijuana  . Sexual activity: Not on file  Other Topics Concern  . 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    Social Determinants of Health   Financial Resource Strain:   . Difficulty of Paying Living Expenses: Not on file  Food Insecurity:   . Worried About Programme researcher, broadcasting/film/video in the Last Year: Not on file  . Ran Out of Food in the Last Year: Not on file  Transportation Needs:   . Lack of Transportation (Medical): Not on file  . Lack of Transportation (Non-Medical): Not on file  Physical Activity:   . Days of Exercise per Week: Not on file  . Minutes of Exercise per Session: Not on file  Stress:   . Feeling of Stress : Not on file  Social Connections:   . Frequency of Communication with Friends and Family:  Not on file  . Frequency of Social Gatherings with Friends and Family: Not on file  . Attends Religious Services: Not on file  . Active Member of Clubs or Organizations: Not on file  . Attends Banker Meetings: Not on file  . Marital Status: Not on file  Intimate Partner Violence:   . Fear of Current or Ex-Partner: Not on file  . Emotionally Abused: Not on file  . Physically Abused: Not on file  . Sexually Abused: Not on file    Past Surgical History:  Procedure Laterality Date  . NO PAST SURGERIES      Family History  Problem Relation Age of Onset  . Cancer Mother     Allergies  Allergen Reactions  . Percocet [Oxycodone-Acetaminophen] Hives       Assessment & Plan:   1. Superficial thrombophlebitis of left leg Patient is advised to continue with Eliquis therapy as recommended by hematology for another 2 months.  Patient is also advised to continue to utilize compression garments for swelling and pain relief.  The patient superficial thrombophlebitis has yet to resolve however he has not shown any evidence of propagation.  We will have the patient return in 2 months repeat noninvasive studies.  Patient will follow up sooner if issues arise.  2. Pain in left lower leg Based on noninvasive studies in addition to patient's report of feeling better, the pain was likely related to the superficial thrombophlebitis.  Patient will continue with regimen as outlined above.   Current Outpatient Medications on File Prior to Visit  Medication Sig Dispense Refill  . acetaminophen (TYLENOL) 500 MG tablet Take 1,000 mg by mouth 2 (two) times daily as needed.     Marland Kitchen apixaban (ELIQUIS) 5 MG TABS tablet Take 1 tablet (5 mg total) by mouth 2 (two) times daily. 60 tablet 0   No current facility-administered medications on file prior to visit.    There are no Patient Instructions on file for this visit. No follow-ups on file.   Georgiana Spinner, NP

## 2020-01-29 ENCOUNTER — Other Ambulatory Visit: Payer: Self-pay | Admitting: *Deleted

## 2020-01-29 NOTE — Telephone Encounter (Signed)
Dr. B do you want patient to continue medication. This RF came from pharmacy.  On your Last note on 12/18/2019, you wrote: continue eliquis for 2 more months [total of 4 months].

## 2020-02-16 ENCOUNTER — Encounter: Payer: Self-pay | Admitting: Internal Medicine

## 2020-02-16 ENCOUNTER — Inpatient Hospital Stay: Payer: Self-pay | Attending: Internal Medicine | Admitting: Internal Medicine

## 2020-02-16 DIAGNOSIS — Z86718 Personal history of other venous thrombosis and embolism: Secondary | ICD-10-CM | POA: Insufficient documentation

## 2020-02-16 DIAGNOSIS — Z885 Allergy status to narcotic agent status: Secondary | ICD-10-CM | POA: Insufficient documentation

## 2020-02-16 DIAGNOSIS — Z809 Family history of malignant neoplasm, unspecified: Secondary | ICD-10-CM | POA: Insufficient documentation

## 2020-02-16 DIAGNOSIS — Z7901 Long term (current) use of anticoagulants: Secondary | ICD-10-CM | POA: Insufficient documentation

## 2020-02-16 DIAGNOSIS — Z79899 Other long term (current) drug therapy: Secondary | ICD-10-CM | POA: Insufficient documentation

## 2020-02-16 DIAGNOSIS — Z87891 Personal history of nicotine dependence: Secondary | ICD-10-CM | POA: Insufficient documentation

## 2020-02-16 DIAGNOSIS — M7989 Other specified soft tissue disorders: Secondary | ICD-10-CM | POA: Insufficient documentation

## 2020-02-16 DIAGNOSIS — I8002 Phlebitis and thrombophlebitis of superficial vessels of left lower extremity: Secondary | ICD-10-CM | POA: Insufficient documentation

## 2020-02-16 NOTE — Progress Notes (Signed)
Pt in for follow up, states taking Eliquis 5mg  twice a day.  Pt states some soreness in left upper thigh area and minimal swelling in left lower leg.

## 2020-02-16 NOTE — Assessment & Plan Note (Addendum)
#  Superficial venous thrombosis-left lower extremity; currently on Eliquis  Since July 2021.  Recommend total of 4 months of anticoagulation; to be finished in the end of November 2021.Awaiting repeat evaluation with vascular end of November.  # Etiology is unclear; hypercoagulable work-up negative so far.   # DISPOSITION:  # follow up  As needed- Dr.B  Cc; Sheppard Plumber, NP

## 2020-02-16 NOTE — Progress Notes (Signed)
[facility]  Patient Care Team: Malfi, Jodelle Gross, FNP as PCP - General (Family Medicine)  CHIEF COMPLAINTS/PURPOSE OF CONSULTATION: DVT/PE  MAY 18th, 2021- LEFT LE [place] . No DVT-superficial thrombophlebitis involving the great saphenous vein at the level of the medial calf.  July 24th 2021-Eliquis; recommend stopping end of November 2021 [4 months]; hypercoagulable work-up negative. Prior history of SVT 3 to 4 years ago [[place]]; SEP 2021- vascular eval.   Oncology History   No history exists.     HISTORY OF PRESENTING ILLNESS:  Nathan Stevenson 44 y.o.  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  In the interim patient was evaluated by vascular surgery awaiting repeat Dopplers.   Notes to have improvement of his leg swelling/pain.  Continued intermittent pain.  Continues to use this compression stockings.  Review of Systems  Constitutional: Negative for chills, diaphoresis, fever, malaise/fatigue and weight loss.  HENT: Negative for nosebleeds and sore throat.   Eyes: Negative for double vision.  Respiratory: Negative for cough, hemoptysis, sputum production, shortness of breath and wheezing.   Cardiovascular: Negative for chest pain, palpitations, orthopnea and leg swelling.  Gastrointestinal: Negative for abdominal pain, blood in stool, constipation, diarrhea, heartburn, melena, nausea and vomiting.  Genitourinary: Negative for dysuria, frequency and urgency.  Musculoskeletal: Negative for back pain and joint pain.  Skin: Negative.  Negative for itching and rash.  Neurological: Negative for dizziness, tingling, focal weakness, weakness and headaches.  Endo/Heme/Allergies: Does not bruise/bleed easily.  Psychiatric/Behavioral: Negative for depression. The patient is not nervous/anxious and does not have insomnia.      MEDICAL HISTORY:  Past  Medical History:  Diagnosis Date  . Thrombophlebitis of superficial veins of right lower extremity     SURGICAL HISTORY: Past Surgical History:  Procedure Laterality Date  . NO PAST SURGERIES      SOCIAL HISTORY: Social History   Socioeconomic History  . Marital status: Legally Separated    Spouse name: Not on file  . Number of children: Not on file  . Years of education: Not on file  . Highest education level: Not on file  Occupational History  . Not on file  Tobacco Use  . Smoking status: Former Smoker    Types: Cigarettes    Quit date: 10/17/2011    Years since quitting: 8.3  . Smokeless tobacco: Never Used  Vaping Use  . Vaping Use: Never used  Substance and Sexual Activity  . Alcohol use: Yes    Alcohol/week: 12.0 standard drinks    Types: 12 Cans of beer per week  . Drug use: Yes    Types: Marijuana  . Sexual activity: Not on file  Other Topics Concern  . 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    Social Determinants of Health   Financial Resource Strain:   . Difficulty of Paying Living Expenses: Not on file  Food Insecurity:   . Worried About Programme researcher, broadcasting/film/video in the Last Year: Not on file  . Ran Out of Food in the Last Year: Not on file  Transportation Needs:   . Lack of Transportation (Medical): Not on file  . Lack of Transportation (Non-Medical): Not on file  Physical Activity:   . Days of Exercise per Week: Not on file  . Minutes of Exercise per  Session: Not on file  Stress:   . Feeling of Stress : Not on file  Social Connections:   . Frequency of Communication with Friends and Family: Not on file  . Frequency of Social Gatherings with Friends and Family: Not on file  . Attends Religious Services: Not on file  . Active Member of Clubs or Organizations: Not on file  . Attends Banker Meetings: Not on file  . Marital Status: Not on file  Intimate Partner Violence:    . Fear of Current or Ex-Partner: Not on file  . Emotionally Abused: Not on file  . Physically Abused: Not on file  . Sexually Abused: Not on file    FAMILY HISTORY: Family History  Problem Relation Age of Onset  . Cancer Mother     ALLERGIES:  is allergic to percocet [oxycodone-acetaminophen].  MEDICATIONS:  Current Outpatient Medications  Medication Sig Dispense Refill  . acetaminophen (TYLENOL) 500 MG tablet Take 1,000 mg by mouth 2 (two) times daily as needed.     Marland Kitchen apixaban (ELIQUIS) 5 MG TABS tablet Take 1 tablet (5 mg total) by mouth 2 (two) times daily. 60 tablet 0   No current facility-administered medications for this visit.      Marland Kitchen  PHYSICAL EXAMINATION:  Vitals:   02/16/20 1420  BP: 116/61  Pulse: (!) 46  Temp: 98.1 F (36.7 C)  SpO2: 100%   Filed Weights   02/16/20 1420  Weight: 142 lb (64.4 kg)    Physical Exam HENT:     Head: Normocephalic and atraumatic.     Mouth/Throat:     Pharynx: No oropharyngeal exudate.  Eyes:     Pupils: Pupils are equal, round, and reactive to light.  Cardiovascular:     Rate and Rhythm: Normal rate and regular rhythm.  Pulmonary:     Effort: Pulmonary effort is normal. No respiratory distress.     Breath sounds: Normal breath sounds. No wheezing.  Abdominal:     General: Bowel sounds are normal. There is no distension.     Palpations: Abdomen is soft. There is no mass.     Tenderness: There is no abdominal tenderness. There is no guarding or rebound.  Musculoskeletal:        General: No tenderness. Normal range of motion.     Cervical back: Normal range of motion and neck supple.  Skin:    General: Skin is warm.     Comments: Thrombophlebitis/swelling pain redness of left superficial vein  Neurological:     Mental Status: He is alert and oriented to person, place, and time.  Psychiatric:        Mood and Affect: Affect normal.      LABORATORY DATA:  I have reviewed the data as listed Lab Results   Component Value Date   WBC 8.0 10/23/2019   HGB 13.5 10/23/2019   HCT 38.2 (L) 10/23/2019   MCV 92.0 10/23/2019   PLT 264 10/23/2019   Recent Labs    08/15/19 0049 10/23/19 1458  NA 140 139  K 3.9 3.9  CL 104 102  CO2 30 30  GLUCOSE 88 97  BUN 21* 16  CREATININE 0.98 0.98  CALCIUM 9.1 9.1  GFRNONAA >60 >60  GFRAA >60 >60  PROT  --  7.8  ALBUMIN  --  4.8  AST  --  21  ALT  --  14  ALKPHOS  --  64  BILITOT  --  0.9    RADIOGRAPHIC  STUDIES: I have personally reviewed the radiological images as listed and agreed with the findings in the report. No results found.  ASSESSMENT & PLAN:   Superficial thrombophlebitis of left leg #Superficial venous thrombosis-left lower extremity; currently on Eliquis  Since July 2021.  Recommend total of 4 months of anticoagulation; to be finished in the end of November 2021.Awaiting repeat evaluation with vascular end of November.  # Etiology is unclear; hypercoagulable work-up negative so far.   # DISPOSITION:  # follow up  As needed- Dr.B  Cc; Sheppard Plumber, NP    Earna Coder, MD 02/18/2020 9:56 AM

## 2020-02-19 ENCOUNTER — Other Ambulatory Visit (INDEPENDENT_AMBULATORY_CARE_PROVIDER_SITE_OTHER): Payer: Self-pay | Admitting: Nurse Practitioner

## 2020-02-19 DIAGNOSIS — I82812 Embolism and thrombosis of superficial veins of left lower extremities: Secondary | ICD-10-CM

## 2020-02-21 ENCOUNTER — Encounter (INDEPENDENT_AMBULATORY_CARE_PROVIDER_SITE_OTHER): Payer: Self-pay | Admitting: Nurse Practitioner

## 2020-02-21 ENCOUNTER — Other Ambulatory Visit: Payer: Self-pay

## 2020-02-21 ENCOUNTER — Ambulatory Visit (INDEPENDENT_AMBULATORY_CARE_PROVIDER_SITE_OTHER): Payer: Self-pay

## 2020-02-21 ENCOUNTER — Ambulatory Visit (INDEPENDENT_AMBULATORY_CARE_PROVIDER_SITE_OTHER): Payer: Self-pay | Admitting: Nurse Practitioner

## 2020-02-21 VITALS — BP 130/74 | HR 71 | Ht 65.0 in | Wt 142.0 lb

## 2020-02-21 DIAGNOSIS — I82812 Embolism and thrombosis of superficial veins of left lower extremities: Secondary | ICD-10-CM

## 2020-02-21 DIAGNOSIS — I8002 Phlebitis and thrombophlebitis of superficial vessels of left lower extremity: Secondary | ICD-10-CM

## 2020-02-27 ENCOUNTER — Encounter (INDEPENDENT_AMBULATORY_CARE_PROVIDER_SITE_OTHER): Payer: Self-pay | Admitting: Nurse Practitioner

## 2020-02-27 NOTE — Progress Notes (Signed)
Subjective:    Patient ID: Nathan Stevenson, male    DOB: 01-14-1976, 44 y.o.   MRN: [id number] Chief Complaint  Patient presents with  . Follow-up    U/S follow up    The patient returns today regarding left lower extremity recurrent superficial thrombophlebitis.  The patient reports that he is doing very well and that he typically has no issues with pain.  The patient continues to utilize compression on a daily basis.  The patient has a physically demanding job and he is able to do his work without issue.  He denies any new swelling of either lower extremity.  He denies any fever, chills, nausea, vomiting or diarrhea.  Overall the patient's leg feels much better.  Previous studies by hematology did not reveal any hypercoagulable conditions.  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       Review of Systems  Cardiovascular: Negative for leg swelling.  All other systems reviewed and are negative.      Objective:   Physical Exam Vitals reviewed.  HENT:     Head: Normocephalic.  Cardiovascular:     Rate and Rhythm: Normal rate.     Pulses: Normal pulses.  Pulmonary:     Effort: Pulmonary effort is normal.  Musculoskeletal:     Left lower leg: Edema present.  Neurological:     Mental Status: He is alert and oriented to person, place, and time.  Psychiatric:        Mood and Affect: Mood normal.        Behavior: Behavior normal.        Thought Content: Thought content normal.        Judgment: Judgment normal.     BP 130/74   Pulse 71   Ht 5\' 5"  (1.651 m)   Wt 142 lb (64.4 kg)   BMI 23.63 kg/m   Past Medical History:  Diagnosis Date  . 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 Marital status: Legally Separated    Spouse name: Not on file  . Number of children: Not  on file  . Years of education: Not on file  . Highest education level: Not on file  Occupational History  . Not on file  Tobacco Use  . Smoking status: Former Smoker    Types: Cigarettes    Quit date: 10/17/2011    Years since quitting: 8.3  . Smokeless tobacco: Never Used  Vaping Use  . Vaping Use: Never used  Substance and Sexual Activity  . Alcohol use: Yes    Alcohol/week: 12.0 standard drinks    Types: 12 Cans of beer per week  . Drug use: Yes    Types: Marijuana  . Sexual activity: Not on file  Other Topics Concern  . Not on file  Social History Narrative   Quit smoking 8 years ago; 12 beers/ week; in [place]. [employer] care.    Social Determinants of Health   Financial Resource Strain:   . Difficulty of Paying Living Expenses: Not on file  Food Insecurity:   . Worried About 10/19/2011 in the Last Year: Not on file  . Ran Out of Food in the Last Year: Not on file  Transportation Needs:   . Lack of Transportation (Medical): Not on file  . Lack of Transportation (  Non-Medical): Not on file  Physical Activity:   . Days of Exercise per Week: Not on file  . Minutes of Exercise per Session: Not on file  Stress:   . Feeling of Stress : Not on file  Social Connections:   . Frequency of Communication with Friends and Family: Not on file  . Frequency of Social Gatherings with Friends and Family: Not on file  . Attends Religious Services: Not on file  . Active Member of Clubs or Organizations: Not on file  . Attends Banker Meetings: Not on file  . Marital Status: Not on file  Intimate Partner Violence:   . Fear of Current or Ex-Partner: Not on file  . Emotionally Abused: Not on file  . Physically Abused: Not on file  . Sexually Abused: Not on file    Past Surgical History:  Procedure Laterality Date  . NO PAST SURGERIES      Family History  Problem Relation Age of Onset  . Cancer Mother     Allergies  Allergen Reactions  . Percocet  [Oxycodone-Acetaminophen] Hives    CBC Latest Ref Rng & Units 10/23/2019 08/15/2019  WBC 4.0 - 10.5 K/uL 8.0 8.1  Hemoglobin 13.0 - 17.0 g/dL 61.9 50.9  Hematocrit 39 - 52 % 38.2(L) 39.1  Platelets 150 - 400 K/uL 264 232      CMP     Component Value Date/Time   NA 139 10/23/2019 1458   K 3.9 10/23/2019 1458   CL 102 10/23/2019 1458   CO2 30 10/23/2019 1458   GLUCOSE 97 10/23/2019 1458   BUN 16 10/23/2019 1458   CREATININE 0.98 10/23/2019 1458   CALCIUM 9.1 10/23/2019 1458   PROT 7.8 10/23/2019 1458   ALBUMIN 4.8 10/23/2019 1458   AST 21 10/23/2019 1458   ALT 14 10/23/2019 1458   ALKPHOS 64 10/23/2019 1458   BILITOT 0.9 10/23/2019 1458   GFRNONAA >60 10/23/2019 1458   GFRAA >60 10/23/2019 1458     No results found.     Assessment & Plan:   1. 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  Discussed with the patient the difference between acute and chronic state.  Patient has been on anticoagulation for approximately 4 months and after discussion with Dr. Dionicia Abler today, it is felt that this is sufficient time for resolution of his recurrent superficial thrombophlebitis.  Patient is advised to stop Eliquis at this time.  Patient is advised to also continue with utilizing his medical grade 1 compression garments in addition to exercise and elevation when possible.  Patient will follow up on an as-needed basis.   Current Outpatient Medications on File Prior to Visit  Medication Sig Dispense Refill  . acetaminophen (TYLENOL) 500 MG tablet Take 1,000 mg by mouth 2 (two) times daily as needed.     Marland Kitchen apixaban (ELIQUIS) 5 MG TABS tablet Take 1 tablet (5 mg total) by mouth 2 (two) times daily. 60 tablet 0   No current facility-administered medications on file prior to visit.    There are no Patient Instructions on file for this visit. No follow-ups on file.   Georgiana Spinner, NP

## 2020-04-01 ENCOUNTER — Other Ambulatory Visit: Payer: Self-pay

## 2020-04-01 DIAGNOSIS — Z20822 Contact with and (suspected) exposure to covid-19: Secondary | ICD-10-CM

## 2020-04-02 LAB — SARS-COV-2, NAA 2 DAY TAT

## 2020-04-02 LAB — NOVEL CORONAVIRUS, NAA: SARS-CoV-2, NAA: DETECTED — AB

## 2021-01-16 IMAGING — US US EXTREM LOW VENOUS*L*
1 series · 13 of 24 positions shown · non-contrast
Comparison: None.

CLINICAL DATA: GSV superficial thrombosis, persistent pain



[Series 1: us extrem low venous*left* · 0.06mm/px · 13 of 39 slices shown]
[im 1/39]
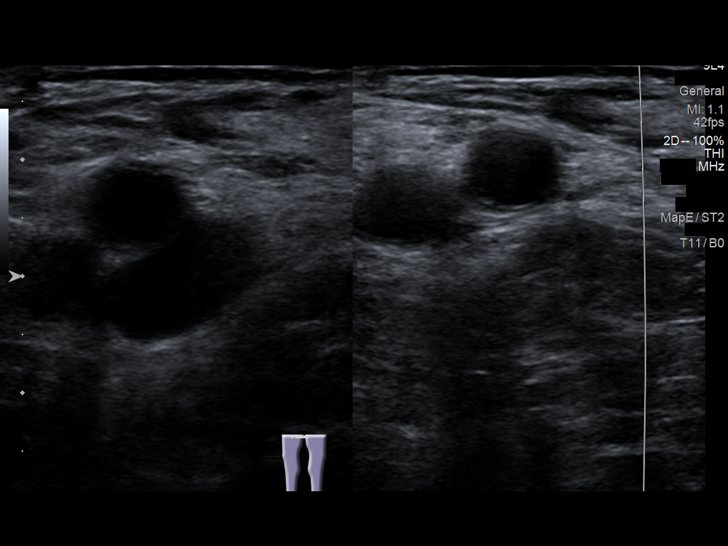
[im 4/39]
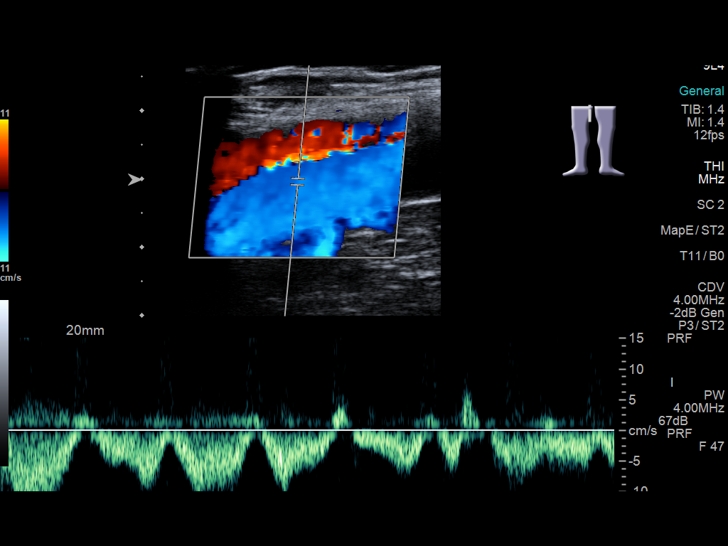
[im 7/39]
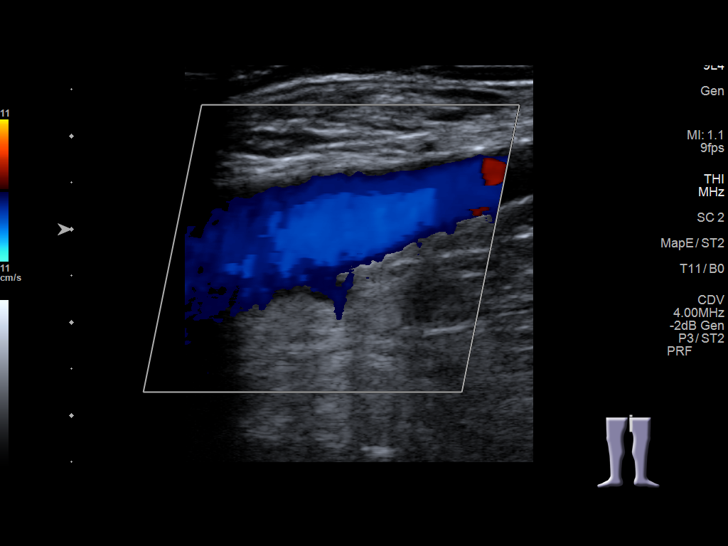
[im 10/39]
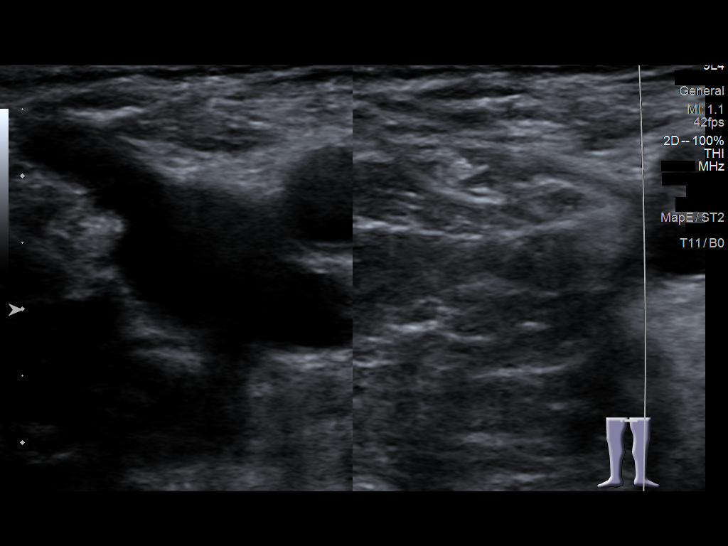
[im 14/39]
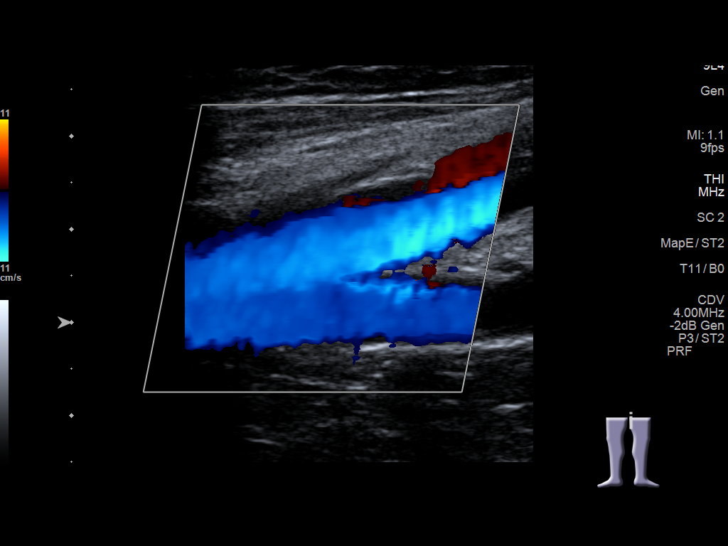
[im 17/39]
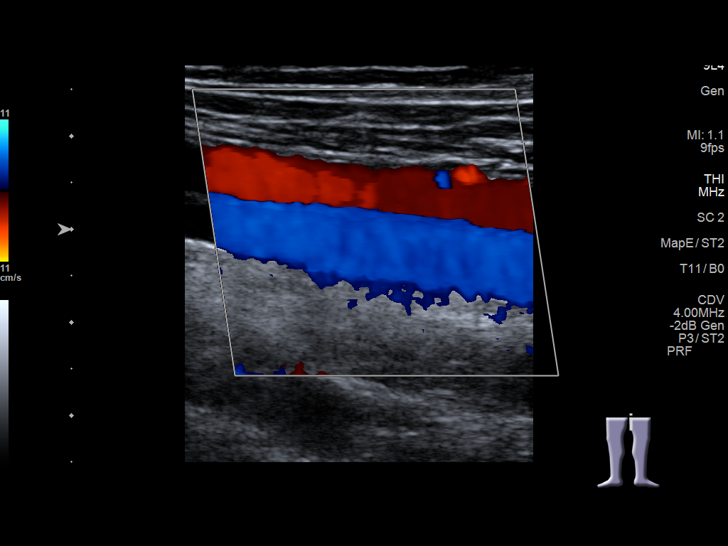
[im 20/39]
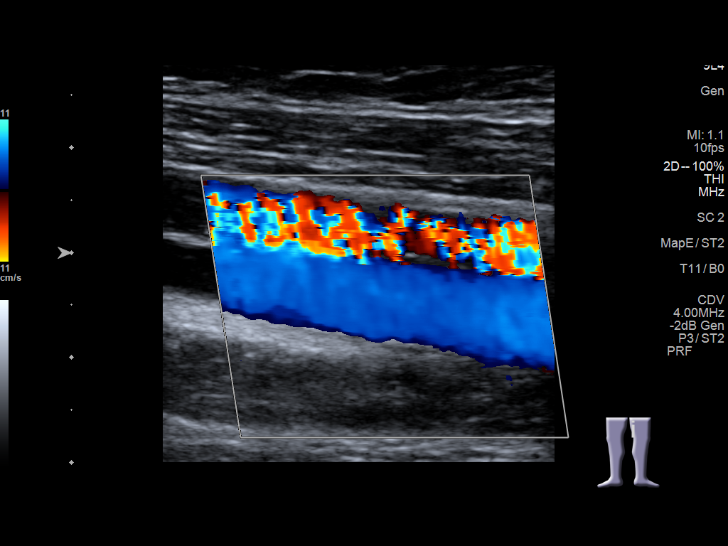
[im 22/39]
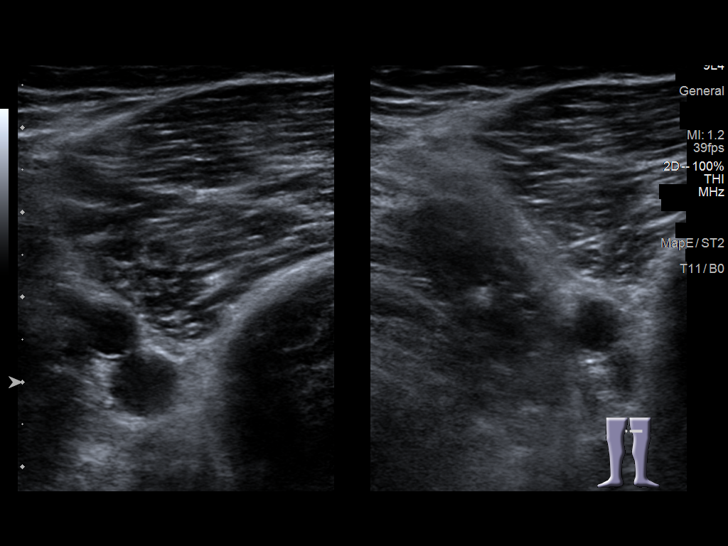
[im 25/39]
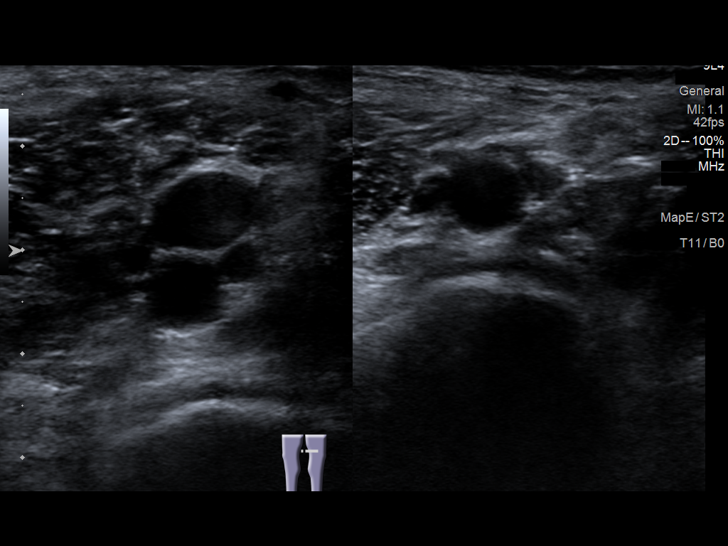
[im 29/39]
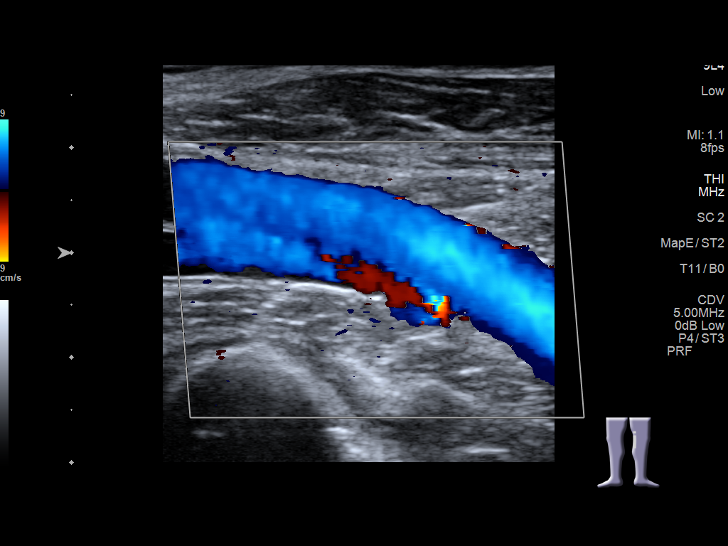
[im 32/39]
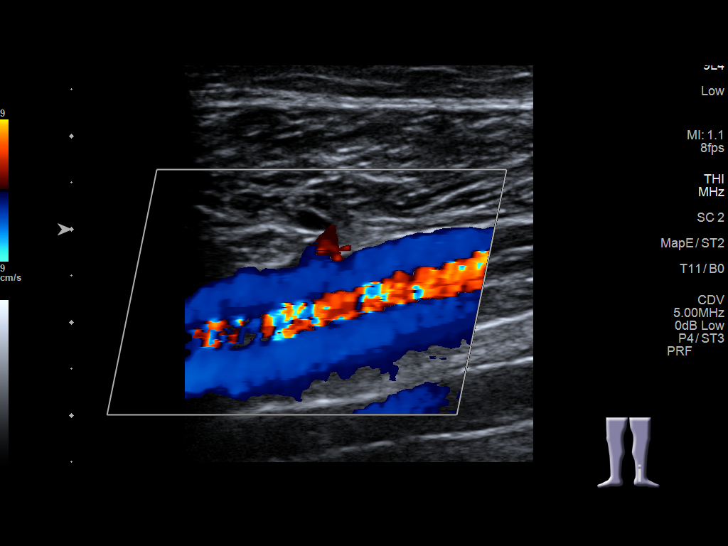
[im 35/39]
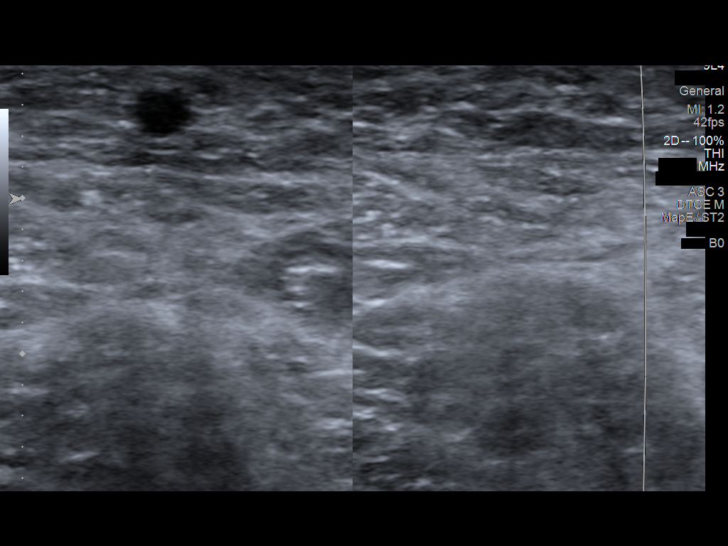
[im 39/39]
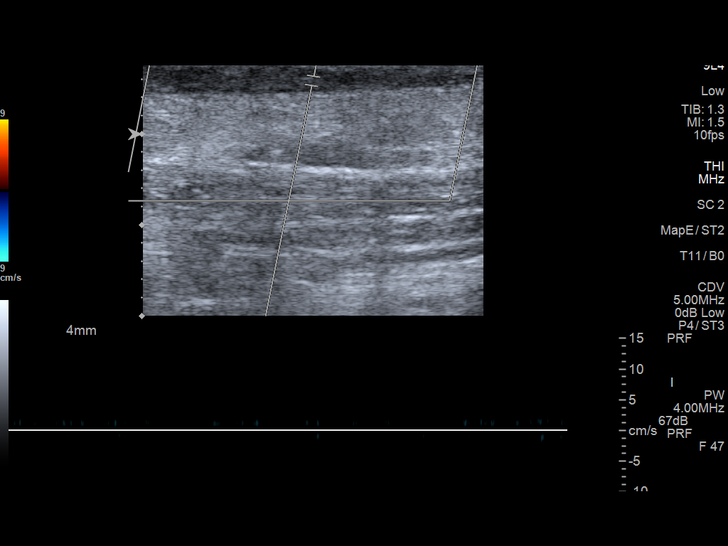

[13 of 24 positions shown; findings below may reference images not displayed]

FINDINGS: Contralateral Common Femoral Vein: Respiratory phasicity is normal
and symmetric with the symptomatic side. No evidence of thrombus.
Normal compressibility.

Common Femoral Vein: No evidence of thrombus. Normal
compressibility, respiratory phasicity and response to augmentation.

Saphenofemoral Junction: No evidence of thrombus. Normal
compressibility and flow on color Doppler imaging.

Profunda Femoral Vein: No evidence of thrombus. Normal
compressibility and flow on color Doppler imaging.

Femoral Vein: No evidence of thrombus. Normal compressibility,
respiratory phasicity and response to augmentation.

Popliteal Vein: No evidence of thrombus. Normal compressibility,
respiratory phasicity and response to augmentation.

Calf Veins: No evidence of thrombus. Normal compressibility and flow
on color Doppler imaging.

Superficial Great Saphenous Vein: Similar superficial thrombosis of
the left GSV beginning above the knee and extending inferiorly into
the proximal medial calf. No significant change.
IMPRESSION: Negative for left lower extremity DVT.

Similar superficial thrombosis/thrombophlebitis of the left GSV
beginning just above the knee and extending into the proximal calf.

## 2022-01-26 ENCOUNTER — Encounter (INDEPENDENT_AMBULATORY_CARE_PROVIDER_SITE_OTHER): Payer: Self-pay
